# Patient Record
Sex: Female | Born: 1965 | Race: White | Hispanic: No | Marital: Married | Smoking: Never smoker
Health system: Southern US, Community
[De-identification: ages and names within clinical notes are randomized; demographics above are authoritative.]

## PROBLEM LIST (undated history)

## (undated) DIAGNOSIS — I10 Essential (primary) hypertension: Secondary | ICD-10-CM

## (undated) DIAGNOSIS — E119 Type 2 diabetes mellitus without complications: Secondary | ICD-10-CM

## (undated) DIAGNOSIS — E785 Hyperlipidemia, unspecified: Secondary | ICD-10-CM

## (undated) HISTORY — DX: Essential (primary) hypertension: I10

## (undated) HISTORY — PX: POLYPECTOMY: SHX149

## (undated) HISTORY — PX: SPINE SURGERY: SHX786

## (undated) HISTORY — PX: HERNIA REPAIR: SHX51

## (undated) HISTORY — DX: Hyperlipidemia, unspecified: E78.5

## (undated) HISTORY — DX: Type 2 diabetes mellitus without complications: E11.9

---

## 1997-12-03 ENCOUNTER — Other Ambulatory Visit: Admission: RE | Admit: 1997-12-03 | Discharge: 1997-12-03 | Payer: Self-pay | Admitting: Obstetrics & Gynecology

## 2001-04-01 ENCOUNTER — Other Ambulatory Visit: Admission: RE | Admit: 2001-04-01 | Discharge: 2001-04-01 | Payer: Self-pay | Admitting: Gynecology

## 2002-04-21 ENCOUNTER — Other Ambulatory Visit: Admission: RE | Admit: 2002-04-21 | Discharge: 2002-04-21 | Payer: Self-pay | Admitting: Gynecology

## 2003-05-18 ENCOUNTER — Other Ambulatory Visit: Admission: RE | Admit: 2003-05-18 | Discharge: 2003-05-18 | Payer: Self-pay | Admitting: Gynecology

## 2003-08-18 ENCOUNTER — Encounter: Admission: RE | Admit: 2003-08-18 | Discharge: 2003-08-18 | Payer: Self-pay | Admitting: Gynecology

## 2004-09-12 ENCOUNTER — Other Ambulatory Visit: Admission: RE | Admit: 2004-09-12 | Discharge: 2004-09-12 | Payer: Self-pay | Admitting: Gynecology

## 2004-09-29 ENCOUNTER — Ambulatory Visit (HOSPITAL_COMMUNITY): Admission: RE | Admit: 2004-09-29 | Discharge: 2004-09-29 | Payer: Self-pay | Admitting: Gynecology

## 2005-12-25 ENCOUNTER — Other Ambulatory Visit: Admission: RE | Admit: 2005-12-25 | Discharge: 2005-12-25 | Payer: Self-pay | Admitting: Gynecology

## 2006-05-23 ENCOUNTER — Ambulatory Visit: Payer: Self-pay | Admitting: General Practice

## 2007-01-22 ENCOUNTER — Other Ambulatory Visit: Admission: RE | Admit: 2007-01-22 | Discharge: 2007-01-22 | Payer: Self-pay | Admitting: Gynecology

## 2007-05-28 ENCOUNTER — Ambulatory Visit: Payer: Self-pay | Admitting: General Practice

## 2007-12-06 ENCOUNTER — Encounter: Admission: RE | Admit: 2007-12-06 | Discharge: 2007-12-06 | Payer: Self-pay | Admitting: Family Medicine

## 2008-05-06 ENCOUNTER — Ambulatory Visit (HOSPITAL_COMMUNITY): Admission: RE | Admit: 2008-05-06 | Discharge: 2008-05-06 | Payer: Self-pay | Admitting: Neurosurgery

## 2009-06-09 ENCOUNTER — Ambulatory Visit: Payer: Self-pay | Admitting: General Practice

## 2010-06-01 ENCOUNTER — Ambulatory Visit: Payer: Self-pay | Admitting: General Practice

## 2010-12-20 NOTE — Op Note (Signed)
[name], [name]                ACCOUNT NO.:  [id number]   MEDICAL RECORD NO.:  [id number]          PATIENT TYPE:  OIB   LOCATION:  3533                         FACILITY:  [facility]   PHYSICIAN:  Cristi Loron, M.D.DATE OF BIRTH:  1966/01/24   DATE OF PROCEDURE:  05/06/2008  DATE OF DISCHARGE:  05/06/2008                               OPERATIVE REPORT   BRIEF HISTORY:  The patient is a 45 year old white female who has  suffered from back and right leg pain consistent with a right S1  radiculopathy.  She failed medical management and was worked up with a  lumbar MRI which demonstrated that she had a herniated disk at L5-S1 on  the right.  I discussed various treatment options with the patient  including surgery.  She has weighed the risks, benefits, and  alternatives of the surgery and decided to proceed with a right L5-S1  diskectomy.   PREOPERATIVE DIAGNOSES:  Right L5-S1 herniated nucleus pulposus, spinal  stenosis, lumbar radiculopathy, lumbago.   POSTOPERATIVE DIAGNOSES:  Right L5-S1 herniated nucleus pulposus, spinal  stenosis, lumbar radiculopathy, and lumbago.   PROCEDURE:  Right L5-S1 diskectomy using microdissection.   SURGEON:  Cristi Loron, MD   ASSISTANT:  Hewitt Shorts, MD   ANESTHESIA:  General endotracheal.   ESTIMATED BLOOD LOSS:  25 mL.   SPECIMENS:  None.   DRAINS:  None.   COMPLICATIONS:  None.   DESCRIPTION OF PROCEDURE:  The patient was brought to the operating room  by the anesthesia team.  General endotracheal anesthesia was induced.  The patient was turned to the prone position on the Wilson frame.  Her  lumbosacral region was then prepared with Betadine scrub and Betadine  solution.  Sterile drapes were applied and injected area to be incised  with Marcaine with epinephrine solution.  I used a scalpel to make a  linear midline incision over the L5-S1 interspace.  I used  electrocautery to perform a right-sided subperiosteal  dissection  exposing the right spinous process lamina of L5 and upper sacrum.  We  obtained intraoperative radiograph to confirm our location and then  inserted the [facility] retractor for exposure.   We then brought the operative microscope into the field under its  magnification and illumination.  We completed the  microdissection/decompression.  I used a high-speed drill to perform a  right L5 laminotomy.  We widened the laminotomy with Kerrison punch and  removed the right L5-S1 ligament flavum.  We removed the cephalad aspect  of the S1 lamina, i.e., performed a foraminotomy about the right S1  nerve root.  We then used microdissection to free up the nerve root and  thecal sac from the epidural tissues.  Dr. Newell Coral then gently  retracted the neural structures medially with the tracheal retractor.  This exposed the free fragment of intervertebral disk herniation.  We  removed the multiple fragments using a pituitary forceps.  There was  some disk fragments in the axilla between the S1 nerve root and the  thecal sac.  We removed all the free fragments.  I then inspected  the  intervertebral disk.  There was a small hole in the annulus, but did not  appear to be any pending herniations.  We therefore did not enter into  the intervertebral disk space.  I then palpated along the ventral  surface of thecal sac along the exit route of the right S1 nerve root.  Neural structures were well decompressed.  We then obtained hemostasis  using bipolar electrocautery.  We irrigated the wound out with  bacitracin solution and removed the retractor and then reapproximated  the patient's thoracolumbar fascia with interrupted #1 Vicryl suture,  subcutaneous tissue with interrupted 2-0 Vicryl suture, and the skin  with Steri-Strips and benzoin.  The wound was then coated with  bacitracin ointment.  Sterile dressing was applied.  The drapes were  removed, and the patient was subsequently returned to  supine position  where she was extubated by the anesthesia team and transported to the  Postanesthesia Care Unit in stable condition.  All sponge, instrument,  and needle counts were correct at the end of this case.      Cristi Loron, M.D.  Electronically Signed     JDJ/MEDQ  D:  05/07/2008  T:  05/07/2008  Job:  [id number]

## 2011-05-08 LAB — CBC
HCT: 37.3
Hemoglobin: 12.4
MCHC: 33.4
MCV: 85.5
Platelets: 314
RBC: 4.36
RDW: 14
WBC: 11.3 — ABNORMAL HIGH

## 2011-05-08 LAB — COMPREHENSIVE METABOLIC PANEL
ALT: 17
AST: 15
Albumin: 3.6
Alkaline Phosphatase: 43
BUN: 4 — ABNORMAL LOW
CO2: 27
Calcium: 8.7
Chloride: 99
Creatinine, Ser: 0.47
GFR calc Af Amer: >60
GFR calc non Af Amer: >60
Glucose, Bld: 129 — ABNORMAL HIGH
Potassium: 3.5
Sodium: 134 — ABNORMAL LOW
Total Bilirubin: 1.1
Total Protein: 6.3

## 2011-05-08 LAB — HCG, SERUM, QUALITATIVE: Preg, Serum: NEGATIVE

## 2011-05-30 ENCOUNTER — Ambulatory Visit: Payer: Self-pay

## 2012-05-29 ENCOUNTER — Ambulatory Visit: Payer: Self-pay

## 2013-06-05 ENCOUNTER — Ambulatory Visit: Payer: Self-pay | Admitting: General Practice

## 2015-02-08 ENCOUNTER — Ambulatory Visit (INDEPENDENT_AMBULATORY_CARE_PROVIDER_SITE_OTHER): Payer: No Typology Code available for payment source | Admitting: Emergency Medicine

## 2015-02-08 VITALS — BP 138/72 | HR 77 | Temp 98.0°F | Resp 17 | Ht 64.25 in | Wt 160.0 lb

## 2015-02-08 DIAGNOSIS — Z0000 Encounter for general adult medical examination without abnormal findings: Secondary | ICD-10-CM

## 2015-02-08 DIAGNOSIS — E119 Type 2 diabetes mellitus without complications: Secondary | ICD-10-CM

## 2015-02-08 DIAGNOSIS — E785 Hyperlipidemia, unspecified: Secondary | ICD-10-CM | POA: Diagnosis not present

## 2015-02-08 LAB — POCT CBC
GRANULOCYTE PERCENT: 62.7 % (ref 37–80)
HCT, POC: 39.8 % (ref 37.7–47.9)
Hemoglobin: 12.6 g/dL (ref 12.2–16.2)
Lymph, poc: 1.5 (ref 0.6–3.4)
MCH, POC: 24.8 pg — AB (ref 27–31.2)
MCHC: 31.8 g/dL (ref 31.8–35.4)
MCV: 78 fL — AB (ref 80–97)
MID (cbc): 0.4 (ref 0–0.9)
MPV: 6.9 fL (ref 0–99.8)
POC Granulocyte: 3.2 (ref 2–6.9)
POC LYMPH PERCENT: 30 %L (ref 10–50)
POC MID %: 7.3 % (ref 0–12)
Platelet Count, POC: 315 10*3/uL (ref 142–424)
RBC: 5.1 M/uL (ref 4.04–5.48)
RDW, POC: 20.7 %
WBC: 5.1 10*3/uL (ref 4.6–10.2)

## 2015-02-08 LAB — POCT GLYCOSYLATED HEMOGLOBIN (HGB A1C): Hemoglobin A1C: 6

## 2015-02-08 LAB — LIPID PANEL
Cholesterol: 228 mg/dL — ABNORMAL HIGH (ref 0–200)
HDL: 52 mg/dL (ref >=46)
LDL Cholesterol: 135 mg/dL — ABNORMAL HIGH (ref 0–99)
Total CHOL/HDL Ratio: 4.4 Ratio
Triglycerides: 204 mg/dL — ABNORMAL HIGH (ref <150)
VLDL: 41 mg/dL — ABNORMAL HIGH (ref 0–40)

## 2015-02-08 LAB — COMPLETE METABOLIC PANEL WITH GFR
ALT: 11 U/L (ref 0–35)
AST: 14 U/L (ref 0–37)
Albumin: 4.4 g/dL (ref 3.5–5.2)
Alkaline Phosphatase: 42 U/L (ref 39–117)
BUN: 12 mg/dL (ref 6–23)
CHLORIDE: 103 meq/L (ref 96–112)
CO2: 25 mEq/L (ref 19–32)
Calcium: 9.3 mg/dL (ref 8.4–10.5)
Creat: 0.58 mg/dL (ref 0.50–1.10)
GFR, Est Non African American: >89 mL/min
Glucose, Bld: 108 mg/dL — ABNORMAL HIGH (ref 70–99)
Potassium: 4.8 mEq/L (ref 3.5–5.3)
Sodium: 140 mEq/L (ref 135–145)
Total Bilirubin: 0.6 mg/dL (ref 0.2–1.2)
Total Protein: 6.9 g/dL (ref 6.0–8.3)

## 2015-02-08 LAB — GLUCOSE, POCT (MANUAL RESULT ENTRY): POC Glucose: 104 mg/dl — AB (ref 70–99)

## 2015-02-08 MED ORDER — METFORMIN HCL 500 MG PO TABS
500.0000 mg | ORAL_TABLET | Freq: Two times a day (BID) | ORAL | Status: DC
Start: 2015-02-08 — End: 2016-01-08

## 2015-02-08 MED ORDER — SIMVASTATIN 40 MG PO TABS: 40.0000 mg | ORAL_TABLET | Freq: Every day | ORAL | Status: DC

## 2015-02-08 NOTE — Progress Notes (Addendum)
Subjective:  This chart was scribed for Arlyss Queen MD,  by Tamsen Roers, at [facility].  This patient was seen in room 2 and the patient's care was started at 10:56 AM.    Patient ID: [name], female    DOB: September 21, 1965, 49 y.o.   MRN: [id number] Chief Complaint  Patient presents with  . Medication Refill    Simvastatin and metformin    HPI  HPI Comments: [name] is a 49 y.o. female with a history of diabetes who presents to the [facility] for medication refill (Simvastatin and Metformin). Patient is complaint with her pap smears, mammograms and has been getting her eyes checked. She has no other complaints today.   Past check-ups: Per patient, her last diabetes check up (with a doctor) was 3 years ago and last cholesterol check (with a doctor) was 7 years ago. She has been having her sugar checked by her company every three months and has been receiving medication but was told that she needs to find a PCP.    There are no active problems to display for this patient.  Past Medical History  Diagnosis Date  . Diabetes mellitus without complication    Past Surgical History  Procedure Laterality Date  . Spine surgery  Disk surgery   No Known Allergies Prior to Admission medications   Medication Sig Start Date End Date Taking? Authorizing Provider  metFORMIN (GLUCOPHAGE) 500 MG tablet Take by mouth 2 (two) times daily with a meal.   Yes Historical Provider, MD  simvastatin (ZOCOR) 40 MG tablet Take 40 mg by mouth daily.   Yes Historical Provider, MD   History   Social History  . Marital Status: Married    Spouse Name: N/A  . Number of Children: N/A  . Years of Education: N/A   Occupational History  . Not on file.   Social History Main Topics  . Smoking status: Never Smoker   . Smokeless tobacco: Not on file  . Alcohol Use: No  . Drug Use: No  . Sexual Activity: Not on file   Other Topics Concern  . Not on  file   Social History Narrative  . No narrative on file      No current outpatient prescriptions on file prior to visit.   No current facility-administered medications on file prior to visit.    No Known Allergies     Review of Systems  Constitutional: Negative for fever and chills.  Eyes: Negative for pain, redness and itching.  Respiratory: Negative for cough, choking and shortness of breath.   Gastrointestinal: Negative for nausea, vomiting, diarrhea and constipation.  Musculoskeletal: Negative for neck pain and neck stiffness.       Objective:   Physical Exam   CONSTITUTIONAL: Well developed/well nourished HEAD: Normocephalic/atraumatic EYES: EOMI/PERRL ENMT: Mucous membranes moist NECK: supple no meningeal signs SPINE/BACK:entire spine nontender CV: S1/S2 noted, no murmurs/rubs/gallops noted LUNGS: Lungs are clear to auscultation bilaterally, no apparent distress ABDOMEN: soft, nontender, no rebound or guarding, bowel sounds noted throughout abdomen GU:no cva tenderness NEURO: Pt is awake/alert/appropriate, moves all extremitiesx4.  No facial droop.   EXTREMITIES: pulses normal/equal, full ROM SKIN: warm, color normal PSYCH: no abnormalities of mood noted, alert and oriented to situation  Filed Vitals:   02/08/15 0937  BP: 138/72  Pulse: 77  Temp: 98 F (36.7 C)  TempSrc: Oral  Resp: 17  Height: 5' 4.25" (1.632 m)  Weight: 160  lb (72.576 kg)  SpO2: 99%   Meds ordered this encounter  Medications  . DISCONTD: simvastatin (ZOCOR) 40 MG tablet    Sig: Take 40 mg by mouth daily.  Marland Kitchen DISCONTD: metFORMIN (GLUCOPHAGE) 500 MG tablet    Sig: Take by mouth 2 (two) times daily with a meal.  . simvastatin (ZOCOR) 40 MG tablet    Sig: Take 1 tablet (40 mg total) by mouth daily.    Dispense:  30 tablet    Refill:  11  . metFORMIN (GLUCOPHAGE) 500 MG tablet    Sig: Take 1 tablet (500 mg total) by mouth 2 (two) times daily with a meal.    Dispense:  60 tablet      Refill:  11   Results for orders placed or performed in visit on 02/08/15  POCT CBC  Result Value Ref Range   WBC 5.1 4.6 - 10.2 K/uL   Lymph, poc 1.5 0.6 - 3.4   POC LYMPH PERCENT 30.0 10 - 50 %L   MID (cbc) 0.4 0 - 0.9   POC MID % 7.3 0 - 12 %M   POC Granulocyte 3.2 2 - 6.9   Granulocyte percent 62.7 37 - 80 %G   RBC 5.10 4.04 - 5.48 M/uL   Hemoglobin 12.6 12.2 - 16.2 g/dL   HCT, POC 39.8 37.7 - 47.9 %   MCV 78.0 (A) 80 - 97 fL   MCH, POC 24.8 (A) 27 - 31.2 pg   MCHC 31.8 31.8 - 35.4 g/dL   RDW, POC 20.7 %   Platelet Count, POC 315 142 - 424 K/uL   MPV 6.9 0 - 99.8 fL  POCT glucose (manual entry)  Result Value Ref Range   POC Glucose 104 (A) 70 - 99 mg/dl  POCT glycosylated hemoglobin (Hb A1C)  Result Value Ref Range   Hemoglobin A1C 6.0       Assessment & Plan:  1. Type 2 diabetes mellitus without complication Hemoglobin Y1O is perfect. - POCT glucose (manual entry) - POCT glycosylated hemoglobin (Hb A1C) - Microalbumin, urine - COMPLETE METABOLIC PANEL WITH GFR  2. Annual physical exam Normal exam today she has regular GYN checkups as well as mammogram - POCT CBC  3. Hyperlipidemia Zocor was refilled today. - Lipid panel   I personally performed the services described in this documentation, which was scribed in my presence. The recorded information has been reviewed and is accurate.  Arlyss Queen, MD  [facility], [facility]  02/08/2015 11:40 AM

## 2015-02-09 LAB — MICROALBUMIN, URINE: Microalb, Ur: 1.2 mg/dL (ref <2.0)

## 2015-02-13 ENCOUNTER — Encounter: Payer: Self-pay | Admitting: Family Medicine

## 2015-05-19 ENCOUNTER — Other Ambulatory Visit: Payer: Self-pay | Admitting: Emergency Medicine

## 2015-05-19 DIAGNOSIS — Z1231 Encounter for screening mammogram for malignant neoplasm of breast: Secondary | ICD-10-CM

## 2015-05-25 ENCOUNTER — Ambulatory Visit
Admission: RE | Admit: 2015-05-25 | Discharge: 2015-05-25 | Disposition: A | Discharge status: Home / Self Care | Payer: No Typology Code available for payment source | Source: Ambulatory Visit | Attending: Emergency Medicine | Admitting: Emergency Medicine

## 2015-05-25 DIAGNOSIS — Z1231 Encounter for screening mammogram for malignant neoplasm of breast: Secondary | ICD-10-CM | POA: Insufficient documentation

## 2015-10-05 ENCOUNTER — Telehealth: Payer: Self-pay

## 2015-10-05 NOTE — Telephone Encounter (Signed)
Patient is calling to request refills for metformin and simvastatin sent to his job in his attention. Fax: [phone]

## 2015-10-06 NOTE — Telephone Encounter (Signed)
He needs an office visit. Called phone, out of service.

## 2016-01-08 ENCOUNTER — Ambulatory Visit (INDEPENDENT_AMBULATORY_CARE_PROVIDER_SITE_OTHER): Payer: No Typology Code available for payment source | Admitting: Family Medicine

## 2016-01-08 VITALS — BP 164/68 | HR 79 | Temp 97.6°F | Resp 18 | Ht 64.0 in | Wt 157.2 lb

## 2016-01-08 DIAGNOSIS — Z130 Encounter for screening for diseases of the blood and blood-forming organs and certain disorders involving the immune mechanism: Secondary | ICD-10-CM

## 2016-01-08 DIAGNOSIS — Z1389 Encounter for screening for other disorder: Secondary | ICD-10-CM | POA: Diagnosis not present

## 2016-01-08 DIAGNOSIS — Z1211 Encounter for screening for malignant neoplasm of colon: Secondary | ICD-10-CM

## 2016-01-08 DIAGNOSIS — Z1212 Encounter for screening for malignant neoplasm of rectum: Secondary | ICD-10-CM | POA: Diagnosis not present

## 2016-01-08 DIAGNOSIS — Z23 Encounter for immunization: Secondary | ICD-10-CM

## 2016-01-08 DIAGNOSIS — E1165 Type 2 diabetes mellitus with hyperglycemia: Secondary | ICD-10-CM | POA: Insufficient documentation

## 2016-01-08 DIAGNOSIS — Z136 Encounter for screening for cardiovascular disorders: Secondary | ICD-10-CM | POA: Diagnosis not present

## 2016-01-08 DIAGNOSIS — Z1329 Encounter for screening for other suspected endocrine disorder: Secondary | ICD-10-CM | POA: Diagnosis not present

## 2016-01-08 DIAGNOSIS — E119 Type 2 diabetes mellitus without complications: Secondary | ICD-10-CM | POA: Diagnosis not present

## 2016-01-08 DIAGNOSIS — Z113 Encounter for screening for infections with a predominantly sexual mode of transmission: Secondary | ICD-10-CM | POA: Diagnosis not present

## 2016-01-08 DIAGNOSIS — E785 Hyperlipidemia, unspecified: Secondary | ICD-10-CM | POA: Diagnosis not present

## 2016-01-08 DIAGNOSIS — Z0000 Encounter for general adult medical examination without abnormal findings: Secondary | ICD-10-CM | POA: Diagnosis not present

## 2016-01-08 DIAGNOSIS — R030 Elevated blood-pressure reading, without diagnosis of hypertension: Secondary | ICD-10-CM | POA: Diagnosis not present

## 2016-01-08 DIAGNOSIS — Z1383 Encounter for screening for respiratory disorder NEC: Secondary | ICD-10-CM | POA: Diagnosis not present

## 2016-01-08 DIAGNOSIS — IMO0001 Reserved for inherently not codable concepts without codable children: Secondary | ICD-10-CM

## 2016-01-08 LAB — LIPID PANEL
Cholesterol: 206 mg/dL — ABNORMAL HIGH (ref 125–200)
HDL: 63 mg/dL (ref >=46)
LDL CALC: 112 mg/dL (ref <130)
Total CHOL/HDL Ratio: 3.3 Ratio (ref <=5.0)
Triglycerides: 155 mg/dL — ABNORMAL HIGH (ref <150)
VLDL: 31 mg/dL — ABNORMAL HIGH (ref <30)

## 2016-01-08 LAB — POCT URINALYSIS DIP (MANUAL ENTRY)
BILIRUBIN UA: NEGATIVE
Bilirubin, UA: NEGATIVE
Blood, UA: NEGATIVE
Glucose, UA: NEGATIVE
NITRITE UA: NEGATIVE
Protein Ur, POC: NEGATIVE
Spec Grav, UA: 1.015
Urobilinogen, UA: 0.2
pH, UA: 7

## 2016-01-08 LAB — COMPREHENSIVE METABOLIC PANEL
ALT: 14 U/L (ref 6–29)
AST: 18 U/L (ref 10–35)
Albumin: 4.6 g/dL (ref 3.6–5.1)
Alkaline Phosphatase: 43 U/L (ref 33–130)
BUN: 11 mg/dL (ref 7–25)
CO2: 29 mmol/L (ref 20–31)
CREATININE: 0.68 mg/dL (ref 0.50–1.05)
Calcium: 9.5 mg/dL (ref 8.6–10.4)
Chloride: 102 mmol/L (ref 98–110)
Glucose, Bld: 144 mg/dL — ABNORMAL HIGH (ref 65–99)
Potassium: 4.7 mmol/L (ref 3.5–5.3)
Sodium: 138 mmol/L (ref 135–146)
Total Bilirubin: 0.5 mg/dL (ref 0.2–1.2)
Total Protein: 7.3 g/dL (ref 6.1–8.1)

## 2016-01-08 LAB — CBC
HEMATOCRIT: 40.2 % (ref 35.0–45.0)
Hemoglobin: 12.7 g/dL (ref 11.7–15.5)
MCH: 25.6 pg — ABNORMAL LOW (ref 27.0–33.0)
MCHC: 31.6 g/dL — ABNORMAL LOW (ref 32.0–36.0)
MCV: 81 fL (ref 80.0–100.0)
MPV: 9.6 fL (ref 7.5–12.5)
Platelets: 330 10*3/uL (ref 140–400)
RBC: 4.96 MIL/uL (ref 3.80–5.10)
RDW: 15.9 % — ABNORMAL HIGH (ref 11.0–15.0)
WBC: 5.4 10*3/uL (ref 3.8–10.8)

## 2016-01-08 LAB — MICROALBUMIN / CREATININE URINE RATIO
Creatinine, Urine: 86 mg/dL (ref 20–320)
MICROALB UR: 0.4 mg/dL
Microalb Creat Ratio: 5 mcg/mg creat (ref <30)

## 2016-01-08 LAB — TSH: TSH: 1.01 mIU/L

## 2016-01-08 LAB — POCT GLYCOSYLATED HEMOGLOBIN (HGB A1C): Hemoglobin A1C: 6.5

## 2016-01-08 MED ORDER — METFORMIN HCL 500 MG PO TABS: 500.0000 mg | ORAL_TABLET | Freq: Two times a day (BID) | ORAL | Status: DC
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
## 2016-01-08 NOTE — Patient Instructions (Signed)
Managing Your High Blood Pressure Blood pressure is a measurement of how forceful your blood is pressing against the walls of the arteries. Arteries are muscular tubes within the circulatory system. Blood pressure does not stay the same. Blood pressure rises when you are active, excited, or nervous; and it lowers during sleep and relaxation. If the numbers measuring your blood pressure stay above normal most of the time, you are at risk for health problems. High blood pressure (hypertension) is a long-term (chronic) condition in which blood pressure is elevated. A blood pressure reading is recorded as two numbers, such as 120 over 80 (or 120/80). The first, higher number is called the systolic pressure. It is a measure of the pressure in your arteries as the heart beats. The second, lower number is called the diastolic pressure. It is a measure of the pressure in your arteries as the heart relaxes between beats.  Keeping your blood pressure in a normal range is important to your overall health and prevention of health problems, such as heart disease and stroke. When your blood pressure is uncontrolled, your heart has to work harder than normal. High blood pressure is a very common condition in adults because blood pressure tends to rise with age. Men and women are equally likely to have hypertension but at different times in life. Before age 45, men are more likely to have hypertension. After 52 years of age, women are more likely to have it. Hypertension is especially common in African Americans. This condition often has no signs or symptoms. The cause of the condition is usually not known. Your caregiver can help you come up with a plan to keep your blood pressure in a normal, healthy range. BLOOD PRESSURE STAGES Blood pressure is classified into four stages: normal, prehypertension, stage 1, and stage 2. Your blood pressure reading will be used to determine what type of treatment, if any, is necessary.  Appropriate treatment options are tied to these four stages:  Normal  Systolic pressure (mm Hg): below 120.  Diastolic pressure (mm Hg): below 80. Prehypertension  Systolic pressure (mm Hg): 120 to 139.  Diastolic pressure (mm Hg): 80 to 89. Stage1  Systolic pressure (mm Hg): 140 to 159.  Diastolic pressure (mm Hg): 90 to 99. Stage2  Systolic pressure (mm Hg): 160 or above.  Diastolic pressure (mm Hg): 100 or above. RISKS RELATED TO HIGH BLOOD PRESSURE Managing your blood pressure is an important responsibility. Uncontrolled high blood pressure can lead to:  A heart attack.  A stroke.  A weakened blood vessel (aneurysm).  Heart failure.  Kidney damage.  Eye damage.  Metabolic syndrome.  Memory and concentration problems. HOW TO MANAGE YOUR BLOOD PRESSURE Blood pressure can be managed effectively with lifestyle changes and medicines (if needed). Your caregiver will help you come up with a plan to bring your blood pressure within a normal range. Your plan should include the following: Education  Read all information provided by your caregivers about how to control blood pressure.  Educate yourself on the latest guidelines and treatment recommendations. New research is always being done to further define the risks and treatments for high blood pressure. Lifestylechanges  Control your weight.  Avoid smoking.  Stay physically active.  Reduce the amount of salt in your diet.  Reduce stress.  Control any chronic conditions, such as high cholesterol or diabetes.  Reduce your alcohol intake. Medicines  Several medicines (antihypertensive medicines) are available, if needed, to bring blood pressure within a normal range.  Communication  Review all the medicines you take with your caregiver because there may be side effects or interactions.  Talk with your caregiver about your diet, exercise habits, and other lifestyle factors that may be contributing to  high blood pressure.  See your caregiver regularly. Your caregiver can help you create and adjust your plan for managing high blood pressure. RECOMMENDATIONS FOR TREATMENT AND FOLLOW-UP  The following recommendations are based on current guidelines for managing high blood pressure in nonpregnant adults. Use these recommendations to identify the proper follow-up period or treatment option based on your blood pressure reading. You can discuss these options with your caregiver.  Systolic pressure of 123456 to [id number] or diastolic pressure of 80 to 89: Follow up with your caregiver as directed.  Systolic pressure of [id number] to 0000000 or diastolic pressure of 90 to 100: Follow up with your caregiver within 2 months.  Systolic pressure above 0000000 or diastolic pressure above [id number]: Follow up with your caregiver within 1 month.  Systolic pressure above [id number] or diastolic pressure above [id number]: Consider antihypertensive therapy; follow up with your caregiver within 1 week.  Systolic pressure above [id number] or diastolic pressure above 123456: Begin antihypertensive therapy; follow up with your caregiver within 1 week.   This information is not intended to replace advice given to you by your health care provider. Make sure you discuss any questions you have with your health care provider.   Document Released: 04/17/2012 Document Reviewed: 04/17/2012 Elsevier Interactive Patient Education 2016 [employer]  Get These Tests  Blood Pressure- Have your blood pressure checked by your healthcare provider at least once a year.  Normal blood pressure is 120/80.  Weight- Have your body mass index (BMI) calculated to screen for obesity.  BMI is a measure of body fat based on height and weight.  You can calculate your own BMI at [employer]  Cholesterol- Have your cholesterol checked every year.  Diabetes- Have your blood sugar checked every year if you have high blood pressure, high cholesterol, a  family history of diabetes or if you are overweight.  Pap Test - Have a pap test every 1 to 5 years if you have been sexually active.  If you are older than 65 and recent pap tests have been normal you may not need additional pap tests.  In addition, if you have had a hysterectomy  for benign disease additional pap tests are not necessary.  Mammogram-Yearly mammograms are essential for early detection of breast cancer  Screening for Colon Cancer- Colonoscopy starting at age 76. Screening may begin sooner depending on your family history and other health conditions.  Follow up colonoscopy as directed by your Gastroenterologist.  Screening for Osteoporosis- Screening begins at age 88 with bone density scanning, sooner if you are at higher risk for developing Osteoporosis.  Get these medicines  Calcium with Vitamin D- Your body requires 1200-1500 mg of Calcium a day and [phone] IU of Vitamin D a day.  You can only absorb 500 mg of Calcium at a time therefore Calcium must be taken in 2 or 3 separate doses throughout the day.  Hormones- Hormone therapy has been associated with increased risk for certain cancers and heart disease.  Talk to your healthcare provider about if you need relief from menopausal symptoms.  Aspirin- Ask your healthcare provider about taking Aspirin to prevent Heart Disease and Stroke.  Get these Immuniztions  Flu shot- Every fall  Pneumonia shot- Once after the age of  33; if you are younger ask your healthcare provider if you need a pneumonia shot.  Tetanus- Every ten years.  Zostavax- Once after the age of 84 to prevent shingles.  Take these steps  Don't smoke- Your healthcare provider can help you quit. For tips on how to quit, ask your healthcare provider or go to www.smokefree.gov or call 1-800 QUIT-NOW.  Be physically active- Exercise 5 days a week for a minimum of 30 minutes.  If you are not already physically active, start slow and gradually work up to 30  minutes of moderate physical activity.  Try walking, dancing, bike riding, swimming, etc.  Eat a healthy diet- Eat a variety of healthy foods such as fruits, vegetables, whole grains, low fat milk, low fat cheeses, yogurt, lean meats, chicken, fish, eggs, dried beans, tofu, etc.  For more information go to www.thenutritionsource.org  Dental visit- Brush and floss teeth twice daily; visit your dentist twice a year.  Eye exam- Visit your Optometrist or Ophthalmologist yearly.  Drink alcohol in moderation- Limit alcohol intake to one drink or less a day.  Never drink and drive.  Depression- Your emotional health is as important as your physical health.  If you're feeling down or losing interest in things you normally enjoy, please talk to your healthcare provider.  Seat Belts- can save your life; always wear one  Smoke/Carbon Monoxide detectors- These detectors need to be installed on the appropriate level of your home.  Replace batteries at least once a year.  Violence- If anyone is threatening or hurting you, please tell your healthcare provider.  Living Will/ Health care power of attorney- Discuss with your healthcare provider and family.

## 2016-01-11 MED ORDER — ATORVASTATIN CALCIUM 20 MG PO TABS: 20.0000 mg | ORAL_TABLET | Freq: Every day | ORAL | Status: DC

## 2016-01-11 MED ORDER — PRAVASTATIN SODIUM 20 MG PO TABS: 20.0000 mg | ORAL_TABLET | Freq: Every day | ORAL | Status: DC

## 2016-01-12 ENCOUNTER — Encounter: Payer: Self-pay | Admitting: Family Medicine

## 2016-01-26 ENCOUNTER — Encounter: Payer: Self-pay | Admitting: Family Medicine

## 2016-02-22 ENCOUNTER — Other Ambulatory Visit: Payer: Self-pay | Admitting: Family Medicine

## 2016-02-23 LAB — CMP12+LP+TP+TSH+6AC+CBC/D/PLT
ALT: 16 IU/L (ref 0–32)
AST: 22 IU/L (ref 0–40)
Albumin/Globulin Ratio: 1.8 (ref 1.2–2.2)
Albumin: 4.3 g/dL (ref 3.5–5.5)
Alkaline Phosphatase: 47 IU/L (ref 39–117)
BASOS ABS: 0 10*3/uL (ref 0.0–0.2)
BUN/Creatinine Ratio: 23 (ref 9–23)
BUN: 13 mg/dL (ref 6–24)
Basos: 1 %
Bilirubin Total: 0.5 mg/dL (ref 0.0–1.2)
CHLORIDE: 97 mmol/L (ref 96–106)
Calcium: 8.9 mg/dL (ref 8.7–10.2)
Chol/HDL Ratio: 3 ratio units (ref 0.0–4.4)
Cholesterol, Total: 149 mg/dL (ref 100–199)
Creatinine, Ser: 0.57 mg/dL (ref 0.57–1.00)
EOS (ABSOLUTE): 0.1 10*3/uL (ref 0.0–0.4)
Eos: 2 %
Estimated CHD Risk: <0.5 times avg. (ref 0.0–1.0)
Free Thyroxine Index: 1.6 (ref 1.2–4.9)
GFR calc Af Amer: 123 mL/min/{1.73_m2} (ref >59)
GFR calc non Af Amer: 107 mL/min/{1.73_m2} (ref >59)
GGT: 18 IU/L (ref 0–60)
Globulin, Total: 2.4 g/dL (ref 1.5–4.5)
Glucose: 101 mg/dL — ABNORMAL HIGH (ref 65–99)
HDL: 49 mg/dL (ref >39)
HEMOGLOBIN: 11.7 g/dL (ref 11.1–15.9)
Hematocrit: 37.6 % (ref 34.0–46.6)
IMMATURE GRANULOCYTES: 0 %
Immature Grans (Abs): 0 10*3/uL (ref 0.0–0.1)
Iron: 86 ug/dL (ref 27–159)
LDH: 157 IU/L (ref 119–226)
LDL Calculated: 70 mg/dL (ref 0–99)
Lymphocytes Absolute: 1.7 10*3/uL (ref 0.7–3.1)
Lymphs: 31 %
MCH: 25.5 pg — ABNORMAL LOW (ref 26.6–33.0)
MCHC: 31.1 g/dL — ABNORMAL LOW (ref 31.5–35.7)
MCV: 82 fL (ref 79–97)
MONOS ABS: 0.3 10*3/uL (ref 0.1–0.9)
Monocytes: 6 %
Neutrophils Absolute: 3.4 10*3/uL (ref 1.4–7.0)
Neutrophils: 60 %
Phosphorus: 3.7 mg/dL (ref 2.5–4.5)
Platelets: 318 10*3/uL (ref 150–379)
Potassium: 4.2 mmol/L (ref 3.5–5.2)
RBC: 4.59 x10E6/uL (ref 3.77–5.28)
RDW: 17.5 % — AB (ref 12.3–15.4)
Sodium: 138 mmol/L (ref 134–144)
T3 Uptake Ratio: 24 % (ref 24–39)
T4, Total: 6.6 ug/dL (ref 4.5–12.0)
TSH: 1.51 u[IU]/mL (ref 0.450–4.500)
Total Protein: 6.7 g/dL (ref 6.0–8.5)
Triglycerides: 148 mg/dL (ref 0–149)
URIC ACID: 5.4 mg/dL (ref 2.5–7.1)
VLDL Cholesterol Cal: 30 mg/dL (ref 5–40)
WBC: 5.6 10*3/uL (ref 3.4–10.8)

## 2016-02-23 LAB — HGB A1C W/O EAG: Hgb A1c MFr Bld: 6.2 % — ABNORMAL HIGH (ref 4.8–5.6)

## 2016-05-24 ENCOUNTER — Other Ambulatory Visit: Payer: Self-pay | Admitting: Family Medicine

## 2016-05-24 DIAGNOSIS — Z1231 Encounter for screening mammogram for malignant neoplasm of breast: Secondary | ICD-10-CM

## 2016-05-29 ENCOUNTER — Ambulatory Visit
Admission: RE | Admit: 2016-05-29 | Discharge: 2016-05-29 | Disposition: A | Discharge status: Home / Self Care | Payer: No Typology Code available for payment source | Source: Ambulatory Visit | Attending: Family Medicine | Admitting: Family Medicine

## 2016-05-29 DIAGNOSIS — Z1231 Encounter for screening mammogram for malignant neoplasm of breast: Secondary | ICD-10-CM | POA: Diagnosis not present

## 2016-08-07 HISTORY — PX: COLONOSCOPY: SHX174

## 2016-10-10 ENCOUNTER — Ambulatory Visit: Payer: Self-pay | Admitting: Registered Nurse

## 2016-10-10 VITALS — BP 130/78 | HR 82 | Temp 98.2°F

## 2016-10-10 DIAGNOSIS — J0101 Acute recurrent maxillary sinusitis: Secondary | ICD-10-CM

## 2016-10-10 DIAGNOSIS — J0111 Acute recurrent frontal sinusitis: Secondary | ICD-10-CM

## 2016-10-10 DIAGNOSIS — H6593 Unspecified nonsuppurative otitis media, bilateral: Secondary | ICD-10-CM

## 2016-10-10 NOTE — Progress Notes (Signed)
Subjective:    Patient ID: [name], female    DOB: 02/03/1966, 51 y.o.   MRN: [id number]  Married 51y/o caucasian female here for  "scratchy" throat, non-productive cough, rhinorrhea, and watery eyes x2 days, Denies eye drainage, red or itchy. No OTCs. + teeth pain, headache, ear pain.  Denied ear discharge/fever/nausea/vomiting/diarrhea/nosebleeds.      Review of Systems  Constitutional: Positive for fatigue. Negative for activity change, appetite change, chills, diaphoresis, fever and unexpected weight change.  HENT: Positive for congestion, ear pain, postnasal drip, rhinorrhea, sinus pain, sinus pressure, sore throat and voice change. Negative for dental problem, drooling, ear discharge, facial swelling, hearing loss, mouth sores, nosebleeds, sneezing, tinnitus and trouble swallowing.   Eyes: Negative for photophobia, pain, discharge, redness, itching and visual disturbance.  Respiratory: Positive for cough. Negative for choking, chest tightness, shortness of breath, wheezing and stridor.   Cardiovascular: Negative for chest pain, palpitations and leg swelling.  Gastrointestinal: Negative for abdominal distention, abdominal pain, blood in stool, constipation, diarrhea, nausea and vomiting.  Endocrine: Negative for cold intolerance and heat intolerance.  Genitourinary: Negative for difficulty urinating, dysuria and hematuria.  Musculoskeletal: Negative for arthralgias, back pain, gait problem, joint swelling, myalgias, neck pain and neck stiffness.  Skin: Negative for color change, pallor, rash and wound.  Allergic/Immunologic: Positive for environmental allergies. Negative for food allergies.  Neurological: Positive for headaches. Negative for dizziness, tremors, seizures, syncope, facial asymmetry, speech difficulty, weakness, light-headedness and numbness.  Hematological: Negative for adenopathy. Does not bruise/bleed easily.  Psychiatric/Behavioral: Negative for agitation,  behavioral problems, confusion and sleep disturbance.       Objective:   Physical Exam  Constitutional: She is oriented to person, place, and time. She appears well-developed and well-nourished. She is active and cooperative.  Non-toxic appearance. She does not have a sickly appearance. She appears ill. No distress.  HENT:  Head: Normocephalic and atraumatic.  Right Ear: Hearing, external ear and ear canal normal. Tympanic membrane is erythematous. A middle ear effusion is present.  Left Ear: Hearing, external ear and ear canal normal. Tympanic membrane is erythematous. A middle ear effusion is present.  Nose: Mucosal edema and rhinorrhea present. No nose lacerations, sinus tenderness, nasal deformity, septal deviation or nasal septal hematoma. No epistaxis.  No foreign bodies. Right sinus exhibits maxillary sinus tenderness and frontal sinus tenderness. Left sinus exhibits maxillary sinus tenderness and frontal sinus tenderness.  Mouth/Throat: Uvula is midline and mucous membranes are normal. Mucous membranes are not pale, not dry and not cyanotic. She does not have dentures. No oral lesions. No trismus in the jaw. Normal dentition. No dental abscesses, uvula swelling, lacerations or dental caries. Posterior oropharyngeal edema and posterior oropharyngeal erythema present. No oropharyngeal exudate or tonsillar abscesses.  Cobblestoning posterior pharynx; bilateral nasal turbinates edema/erythema clear discharge; bilateral allergic shiners; bilateral TMs air fluid level slight erythema 7 oclock; hoarse voice  Eyes: Conjunctivae, EOM and lids are normal. Pupils are equal, round, and reactive to light. Right eye exhibits no chemosis, no discharge, no exudate and no hordeolum. No foreign body present in the right eye. Left eye exhibits no chemosis, no discharge, no exudate and no hordeolum. No foreign body present in the left eye. Right conjunctiva is not injected. Right conjunctiva has no hemorrhage.  Left conjunctiva is not injected. Left conjunctiva has no hemorrhage. No scleral icterus. Right eye exhibits normal extraocular motion and no nystagmus. Left eye exhibits normal extraocular motion and no nystagmus. Right pupil is round and reactive. Left pupil is  round and reactive. Pupils are equal.  Neck: Trachea normal and normal range of motion. Neck supple. No tracheal tenderness, no spinous process tenderness and no muscular tenderness present. No neck rigidity. No tracheal deviation, no edema, no erythema and normal range of motion present. No thyroid mass and no thyromegaly present.  Cardiovascular: Normal rate, regular rhythm, S1 normal, S2 normal, normal heart sounds and intact distal pulses.  PMI is not displaced.  Exam reveals no gallop and no friction rub.   No murmur heard. Pulmonary/Chest: Effort normal and breath sounds normal. No accessory muscle usage or stridor. No respiratory distress. She has no decreased breath sounds. She has no wheezes. She has no rhonchi. She has no rales. She exhibits no tenderness.  Speaks full sentences without difficulty  Abdominal: Soft. She exhibits no distension.  Musculoskeletal: Normal range of motion. She exhibits no edema or tenderness.       Right shoulder: Normal.       Left shoulder: Normal.       Right hip: Normal.       Left hip: Normal.       Right knee: Normal.       Left knee: Normal.       Cervical back: Normal.       Right hand: Normal.       Left hand: Normal.  Lymphadenopathy:       Head (right side): No submental, no submandibular, no tonsillar, no preauricular, no posterior auricular and no occipital adenopathy present.       Head (left side): No submental, no submandibular, no tonsillar, no preauricular, no posterior auricular and no occipital adenopathy present.    She has no cervical adenopathy.       Right cervical: No superficial cervical, no deep cervical and no posterior cervical adenopathy present.      Left cervical: No  superficial cervical, no deep cervical and no posterior cervical adenopathy present.  Neurological: She is alert and oriented to person, place, and time. She has normal strength. She is not disoriented. She displays no atrophy and no tremor. No cranial nerve deficit or sensory deficit. She exhibits normal muscle tone. She displays no seizure activity. Coordination and gait normal. GCS eye subscore is 4. GCS verbal subscore is 5. GCS motor subscore is 6.  Skin: Skin is warm, dry and intact. No abrasion, no bruising, no burn, no ecchymosis, no laceration, no lesion, no petechiae and no rash noted. She is not diaphoretic. No cyanosis or erythema. No pallor. Nails show no clubbing.  Psychiatric: She has a normal mood and affect. Her speech is normal and behavior is normal. Judgment and thought content normal. Cognition and memory are normal.  Nursing note and vitals reviewed.         Assessment & Plan:  A-acute recurrent maxillary and frontal sinusitis and bilateral otitis media effusion  P-Last augmentin Jun 2017 for sinusitis tolerated well See PDRX dispensed augmentin 875mg  po BID x 10 days #20 RF0.  Start nasal saline 2 sprays each nostril q2h wa. Given 1 UD from clinic stock. Shower BID.  Tylenol 1000mg  po QID prn pain. Given 4 UD from clinic [place].  Flonase 56mcg 1 spray each nostril BID #1 RF0  No evidence of systemic bacterial infection, non toxic and well hydrated.  I do not see where any further testing or imaging is necessary at this time.   I will suggest supportive care, rest, good hygiene and encourage the patient to take adequate  fluids.  The patient is to return to clinic or EMERGENCY ROOM if symptoms worsen or change significantly.   Patient verbalized agreement and understanding of treatment plan and had no further questions at this time.   P2:  Hand washing and cover cough  Supportive treatment.   No evidence of invasive bacterial infection, non toxic and well hydrated.  This  is most likely self limiting viral infection.  I do not see where any further testing or imaging is necessary at this time.   I will suggest supportive care, rest, good hygiene and encourage the patient to take adequate fluids.  The patient is to return to clinic or EMERGENCY ROOM if symptoms worsen or change significantly e.g. ear pain, fever, purulent discharge from ears or bleeding.   Patient verbalized agreement and understanding of treatment plan.

## 2016-12-11 ENCOUNTER — Telehealth: Payer: Self-pay | Admitting: *Deleted

## 2016-12-11 NOTE — Telephone Encounter (Signed)
Pt requests refills of Contour glucometer test strips and lancets. [facility] on [address].

## 2016-12-12 ENCOUNTER — Encounter: Payer: Self-pay | Admitting: *Deleted

## 2016-12-12 MED ORDER — GLUCOSE BLOOD VI STRP: ORAL_STRIP | 12 refills | Status: DC

## 2016-12-12 MED ORDER — LANCETS 28G MISC: 1.0000 | Freq: Four times a day (QID) | 3 refills | Status: DC | PRN

## 2016-12-18 ENCOUNTER — Telehealth: Payer: Self-pay | Admitting: *Deleted

## 2016-12-18 NOTE — Telephone Encounter (Signed)
Pt to RN office. Reports blood glucose test strips that were ordered were denied coverage by insurance company when she attempted to pick them up from pharmacy. RN called [employer].They report only the "One Touch" test strips are covered under the employee's Rx plan. Pt requests new Rx for covered strips be sent to [facility]. Lancets were covered.

## 2016-12-19 ENCOUNTER — Encounter: Payer: Self-pay | Admitting: *Deleted

## 2016-12-19 MED ORDER — GLUCOSE BLOOD VI STRP: ORAL_STRIP | 12 refills | Status: DC

## 2016-12-19 NOTE — Telephone Encounter (Signed)
Rx entered for patient to pick up.  Please notify patient.

## 2017-02-15 ENCOUNTER — Ambulatory Visit: Payer: Self-pay | Admitting: Registered Nurse

## 2017-02-15 VITALS — BP 137/81 | HR 74 | Temp 98.9°F

## 2017-02-15 DIAGNOSIS — S46912A Strain of unspecified muscle, fascia and tendon at shoulder and upper arm level, left arm, initial encounter: Secondary | ICD-10-CM

## 2017-02-15 MED ORDER — MENTHOL (TOPICAL ANALGESIC) 4 % EX GEL: 1.0000 | Freq: Four times a day (QID) | CUTANEOUS | 0 refills | Status: DC | PRN

## 2017-02-15 MED ORDER — IBUPROFEN 800 MG PO TABS: 800.0000 mg | ORAL_TABLET | Freq: Three times a day (TID) | ORAL | 0 refills | Status: AC | PRN

## 2017-02-15 NOTE — Progress Notes (Signed)
Subjective:    Patient ID: [name], female    DOB: [birth year], 51 y.o.   MRN: [id number]  51y/o caucasian married established patient  reports to L shoulder into L neck x6 days. Began using 800mg  Ibuprofen po BID and Biofreeze gel topical area bedtime 2 days ago after nurse visit and reports pain is much improved. Was previously not able to move neck without much pain in the morning but now she can have AROM without pain.  Tried thermacare neck patch and helped also.  Here for re-evaluation.  Denied weakness, paresthesias, rash, swelling, bruising, injury.  "Maybe I slept on it wrong".  Pain typically worst upon awakening when lifting left arm feels pain outside and upper back when moves above shoulder height.  Some pain under armpit on chest also with movement of left arm      Review of Systems  Constitutional: Negative for activity change, appetite change, chills, diaphoresis, fatigue and fever.  HENT: Negative for congestion and ear pain.   Eyes: Negative for pain and discharge.  Respiratory: Negative for cough and wheezing.   Cardiovascular: Negative for chest pain and leg swelling.  Gastrointestinal: Negative for diarrhea and vomiting.  Endocrine: Negative for cold intolerance and heat intolerance.  Genitourinary: Negative for decreased urine volume and difficulty urinating.  Musculoskeletal: Positive for myalgias and neck pain. Negative for arthralgias, back pain and neck stiffness.  Skin: Negative for rash.  Allergic/Immunologic: Negative for environmental allergies and food allergies.  Neurological: Negative for dizziness, tremors, seizures, syncope, facial asymmetry, speech difficulty, weakness, light-headedness, numbness and headaches.  Hematological: Negative for adenopathy. Does not bruise/bleed easily.  Psychiatric/Behavioral: Negative for agitation, confusion and sleep disturbance. The patient is not nervous/anxious.        Objective:   Physical Exam   Constitutional: She is oriented to person, place, and time. Vital signs are normal. She appears well-developed and well-nourished. She is active and cooperative.  Non-toxic appearance. She does not have a sickly appearance. She does not appear ill. No distress.  HENT:  Head: Normocephalic and atraumatic.  Right Ear: Hearing and external ear normal.  Left Ear: Hearing and external ear normal.  Nose: Nose normal.  Mouth/Throat: Uvula is midline, oropharynx is clear and moist and mucous membranes are normal. No oropharyngeal exudate.  Eyes: Pupils are equal, round, and reactive to light. Conjunctivae, EOM and lids are normal. Right eye exhibits no discharge. Left eye exhibits no discharge. No scleral icterus.  Neck: Trachea normal, normal range of motion and phonation normal. Neck supple. Muscular tenderness present. No tracheal tenderness and no spinous process tenderness present. No neck rigidity. No tracheal deviation, no edema, no erythema and normal range of motion present.    Trapezius tight slightly TTP left  Cardiovascular: Normal rate, regular rhythm, normal heart sounds and intact distal pulses.  Exam reveals no gallop and no friction rub.   No murmur heard. Pulses:      Radial pulses are 2+ on the right side, and 2+ on the left side.  Pulmonary/Chest: Effort normal and breath sounds normal. No accessory muscle usage or stridor. No respiratory distress. She has no decreased breath sounds. She has no wheezes. She has no rhonchi. She has no rales. She exhibits no tenderness.  Abdominal: Soft. She exhibits no distension. There is no guarding.  Musculoskeletal: She exhibits tenderness. She exhibits no edema or deformity.       Right shoulder: Normal.       Left shoulder: She exhibits decreased range of  motion, tenderness, pain and spasm. She exhibits no bony tenderness, no swelling, no effusion, no crepitus, no deformity, no laceration, normal pulse and normal strength.       Right elbow:  Normal.      Left elbow: Normal.       Right wrist: Normal.       Left wrist: Normal.       Right hip: Normal.       Left hip: Normal.       Right knee: Normal.       Left knee: Normal.       Cervical back: She exhibits tenderness and spasm. She exhibits normal range of motion, no bony tenderness, no swelling, no edema, no deformity, no laceration, no pain and normal pulse.       Thoracic back: Normal.       Lumbar back: Normal.       Right upper arm: Normal.       Left upper arm: Normal.       Arms:      Right hand: Normal.       Left hand: Normal.  TTP left trapezius, superior scapular and deltoid superior and latismus dorsi left midaxillary line; full arom; pain with abduction above shoulder level negative neers, empty beer can, atchley scratch, internal/external rotation bilaterally  Lymphadenopathy:       Head (right side): No submental, no submandibular, no tonsillar, no preauricular, no posterior auricular and no occipital adenopathy present.       Head (left side): No submental, no submandibular, no tonsillar, no preauricular, no posterior auricular and no occipital adenopathy present.    She has no cervical adenopathy.       Right cervical: No superficial cervical, no deep cervical and no posterior cervical adenopathy present.      Left cervical: No superficial cervical, no deep cervical and no posterior cervical adenopathy present.       Left axillary: No pectoral and no lateral adenopathy present. Neurological: She is alert and oriented to person, place, and time. She has normal strength. She displays no atrophy and no tremor. No cranial nerve deficit or sensory deficit. She exhibits normal muscle tone. She displays no seizure activity. Coordination and gait normal.  On/off exam table; in/out of chair without difficulty; gait sur e and steady in clinic  Skin: Skin is warm, dry and intact. No abrasion, no bruising, no burn, no ecchymosis, no laceration, no lesion, no petechiae and  no rash noted. She is not diaphoretic. No cyanosis or erythema. No pallor. Nails show no clubbing.  Psychiatric: She has a normal mood and affect. Her speech is normal and behavior is normal. Judgment and thought content normal. Cognition and memory are normal.  Nursing note and vitals reviewed.         Assessment & Plan:  A-left neck/shoulder strain initial visit acute  P-Rx motrin 800mg  po TID prn x 2 weeks #30 Rf0 dispensed from [facility] given biofreeze 4 UD packages from clinic stock apply TID prn pain given 1 reusable ice/heat back from clinic stock and applied 1 cervical theramcare patch to patient in clinic from stock.  Has tylenol at home discussed may use 1000mg  po QID prn pain also.  Demonstrated stretches and discussed ergonomics lifting/repetitive motion.  Home stretches demonstrated to patient-e.g. Arm circles, walking up wall, chest stretches, neck AROM, chin tucks, knee to chest and rock side to side on back.  Consider flexeril/ physical therapy referral if no improvement with prescribed therapy.  Ensure ergonomics correct desk at work avoid repetitive motions if possible/holding phone/laptop in hand use desk/stand.  Patient was instructed to rest, ice/heat 15 minutes QID, and ROM exercises.  Activity as tolerated.   Follow up if symptoms persist or worsen especially worsening pain and/or arm/hand weakness/paresthesias.  Exitcare handout on shoulder pain, muscle spasms and cervical/thoracic strain with rehab exercises given to patient.  Patient verbalized agreement and understanding of treatment plan.  P2:  Injury Prevention and Fitness.

## 2017-02-15 NOTE — Patient Instructions (Signed)
Muscle Strain A muscle strain is an injury that occurs when a muscle is stretched beyond its normal length. Usually a small number of muscle fibers are torn when this happens. Muscle strain is rated in degrees. First-degree strains have the least amount of muscle fiber tearing and pain. Second-degree and third-degree strains have increasingly more tearing and pain. Usually, recovery from muscle strain takes 1-2 weeks. Complete healing takes 5-6 weeks. What are the causes? Muscle strain happens when a sudden, violent force placed on a muscle stretches it too far. This may occur with lifting, sports, or a fall. What increases the risk? Muscle strain is especially common in athletes. What are the signs or symptoms? At the site of the muscle strain, there may be:  Pain.  Bruising.  Swelling.  Difficulty using the muscle due to pain or lack of normal function.  How is this diagnosed? Your health care provider will perform a physical exam and ask about your medical history. How is this treated? Often, the best treatment for a muscle strain is resting, icing, and applying cold compresses to the injured area. Follow these instructions at home:  Use the PRICE method of treatment to promote muscle healing during the first 2-3 days after your injury. The PRICE method involves: ? Protecting the muscle from being injured again. ? Restricting your activity and resting the injured body part. ? Icing your injury. To do this, put ice in a plastic bag. Place a towel between your skin and the bag. Then, apply the ice and leave it on from 15-20 minutes each hour. After the third day, switch to moist heat packs. ? Apply compression to the injured area with a splint or elastic bandage. Be careful not to wrap it too tightly. This may interfere with blood circulation or increase swelling. ? Elevate the injured body part above the level of your heart as often as you can.  Only take over-the-counter or  prescription medicines for pain, discomfort, or fever as directed by your health care provider.  Warming up prior to exercise helps to prevent future muscle strains. Contact a health care provider if:  You have increasing pain or swelling in the injured area.  You have numbness, tingling, or a significant loss of strength in the injured area. This information is not intended to replace advice given to you by your health care provider. Make sure you discuss any questions you have with your health care provider. Document Released: 07/24/2005 Document Revised: 12/30/2015 Document Reviewed: 02/20/2013 Elsevier Interactive Patient Education  2017 [employer]. Mid-Back Strain Rehab Ask your health care provider which exercises are safe for you. Do exercises exactly as told by your health care provider and adjust them as directed. It is normal to feel mild stretching, pulling, tightness, or discomfort as you do these exercises, but you should stop right away if you feel sudden pain or your pain gets worse. Do not begin these exercises until told by your health care provider. Stretching and range of motion exercises This exercise warms up your muscles and joints and improves the movement and flexibility of your back and shoulders. This exercise also help to relieve pain. Exercise A: Chest and spine stretch  1. Lie down on your back on a firm surface. 2. Roll a towel or a small blanket so it is about 4 inches (10 cm) in diameter. 3. Put the towel lengthwise under the middle of your back so it is under your spine, but not under your shoulder blades.  4. To increase the stretch, you may put your hands behind your head and let your elbows fall to your sides. 5. Hold for __________ seconds. Repeat exercise __________ times. Complete this exercise __________ times a day. Strengthening exercises These exercises build strength and endurance in your back and your shoulder blade muscles. Endurance is the  ability to use your muscles for a long time, even after they get tired. Exercise B: Alternating arm and leg raises  1. Get on your hands and knees on a firm surface. If you are on a hard floor, you may want to use padding to cushion your knees, such as an exercise mat. 2. Line up your arms and legs. Your hands should be below your shoulders, and your knees should be below your hips. 3. Lift your left leg behind you. At the same time, raise your right arm and straighten it in front of you. ? Do not lift your leg higher than your hip. ? Do not lift your arm higher than your shoulder. ? Keep your abdominal and back muscles tight. ? Keep your hips facing the ground. ? Do not arch your back. ? Keep your balance carefully, and do not hold your breath. 4. Hold for __________ seconds. 5. Slowly return to the starting position and repeat with your right leg and your left arm. Repeat __________ times. Complete this exercise __________ times a day. Exercise C: Straight arm rows ( shoulder extension) 1. Stand with your feet shoulder width apart. 2. Secure an exercise band to a stable object in front of you so the band is at or above shoulder height. 3. Hold one end of the exercise band in each hand. 4. Straighten your elbows and lift your hands up to shoulder height. 5. Step back, away from the secured end of the exercise band, until the band stretches. 6. Squeeze your shoulder blades together and pull your hands down to the sides of your thighs. Stop when your hands are straight down by your sides. Do not let your hands go behind your body. 7. Hold for __________ seconds. 8. Slowly return to the starting position. Repeat __________ times. Complete this exercise __________ times a day. Exercise D: Shoulder external rotation, prone 1. Lie on your abdomen on a firm bed so your left / right forearm hangs over the edge of the bed and your upper arm is on the bed, straight out from your body. ? Your elbow  should be bent. ? Your palm should be facing your feet. 2. If instructed, hold a __________ weight in your hand. 3. Squeeze your shoulder blade toward the middle of your back. Do not let your shoulder lift toward your ear. 4. Keep your elbow bent in an "L" shape (90 degrees) while you slowly move your forearm up toward the ceiling. Move your forearm up to the height of the bed, toward your head. ? Your upper arm should not move. ? At the top of the movement, your palm should face the floor. 5. Hold for __________ seconds. 6. Slowly return to the starting position and relax your muscles. Repeat __________ times. Complete this exercise __________ times a day. Exercise E: Scapular retraction and external rotation, rowing  1. Sit in a stable chair without armrests, or stand. 2. Secure an exercise band to a stable object in front of you so it is at shoulder height. 3. Hold one end of the exercise band in each hand. 4. Bring your arms out straight in front of you.  5. Step back, away from the secured end of the exercise band, until the band stretches. 6. Pull the band backward. As you do this, bend your elbows and squeeze your shoulder blades together, but avoid letting the rest of your body move. Do not let your shoulders lift up toward your ears. 7. Stop when your elbows are at your sides or slightly behind your body. 8. Hold for __________ seconds. 9. Slowly straighten your arms to return to the starting position. Repeat __________ times. Complete this exercise __________ times a day. Posture and body mechanics  Body mechanics refers to the movements and positions of your body while you do your daily activities. Posture is part of body mechanics. Good posture and healthy body mechanics can help to relieve stress in your body's tissues and joints. Good posture means that your spine is in its natural S-curve position (your spine is neutral), your shoulders are pulled back slightly, and your head is  not tipped forward. The following are general guidelines for applying improved posture and body mechanics to your everyday activities. Standing   When standing, keep your spine neutral and your feet about hip-width apart. Keep a slight bend in your knees. Your ears, shoulders, and hips should line up.  When you do a task in which you lean forward while standing in one place for a long time, place one foot up on a stable object that is 2-4 inches (5-10 cm) high, such as a footstool. This helps keep your spine neutral. Sitting   When sitting, keep your spine neutral and keep your feet flat on the floor. Use a footrest, if necessary, and keep your thighs parallel to the floor. Avoid rounding your shoulders, and avoid tilting your head forward.  When working at a desk or a computer, keep your desk at a height where your hands are slightly lower than your elbows. Slide your chair under your desk so you are close enough to maintain good posture.  When working at a computer, place your monitor at a height where you are looking straight ahead and you do not have to tilt your head forward or downward to look at the screen. Resting  When lying down and resting, avoid positions that are most painful for you.  If you have pain with activities such as sitting, bending, stooping, or squatting (flexion-based activities), lie in a position in which your body does not bend very much. For example, avoid curling up on your side with your arms and knees near your chest (fetal position).  If you have pain with activities such as standing for a long time or reaching with your arms (extension-based activities), lie with your spine in a neutral position and bend your knees slightly. Try the following positions:  Lying on your side with a pillow between your knees.  Lying on your back with a pillow under your knees.  Lifting   When lifting objects, keep your feet at least shoulder-width apart and tighten your  abdominal muscles.  Bend your knees and hips and keep your spine neutral. It is important to lift using the strength of your legs, not your back. Do not lock your knees straight out.  Always ask for help to lift heavy or awkward objects. This information is not intended to replace advice given to you by your health care provider. Make sure you discuss any questions you have with your health care provider. Document Released: 07/24/2005 Document Revised: 03/30/2016 Document Reviewed: 05/05/2015 Elsevier Interactive Patient Education  2018 [employer] Cervical Strain and Sprain Rehab Ask your health care provider which exercises are safe for you. Do exercises exactly as told by your health care provider and adjust them as directed. It is normal to feel mild stretching, pulling, tightness, or discomfort as you do these exercises, but you should stop right away if you feel sudden pain or your pain gets worse.Do not begin these exercises until told by your health care provider. Stretching and range of motion exercises These exercises warm up your muscles and joints and improve the movement and flexibility of your neck. These exercises also help to relieve pain, numbness, and tingling. Exercise A: Cervical side bend  1. Using good posture, sit on a stable chair or stand up. 2. Without moving your shoulders, slowly tilt your left / right ear to your shoulder until you feel a stretch in your neck muscles. You should be looking straight ahead. 3. Hold for __________ seconds. 4. Repeat with the other side of your neck. Repeat __________ times. Complete this exercise __________ times a day. Exercise B: Cervical rotation  1. Using good posture, sit on a stable chair or stand up. 2. Slowly turn your head to the side as if you are looking over your left / right shoulder. ? Keep your eyes level with the ground. ? Stop when you feel a stretch along the side and the back of your neck. 3. Hold for  __________ seconds. 4. Repeat this by turning to your other side. Repeat __________ times. Complete this exercise __________ times a day. Exercise C: Thoracic extension and pectoral stretch 1. Roll a towel or a small blanket so it is about 4 inches (10 cm) in diameter. 2. Lie down on your back on a firm surface. 3. Put the towel lengthwise, under your spine in the middle of your back. It should not be not under your shoulder blades. The towel should line up with your spine from your middle back to your lower back. 4. Put your hands behind your head and let your elbows fall out to your sides. 5. Hold for __________ seconds. Repeat __________ times. Complete this exercise __________ times a day. Strengthening exercises These exercises build strength and endurance in your neck. Endurance is the ability to use your muscles for a long time, even after your muscles get tired. Exercise D: Upper cervical flexion, isometric 1. Lie on your back with a thin pillow behind your head and a small rolled-up towel under your neck. 2. Gently tuck your chin toward your chest and nod your head down to look toward your feet. Do not lift your head off the pillow. 3. Hold for __________ seconds. 4. Release the tension slowly. Relax your neck muscles completely before you repeat this exercise. Repeat __________ times. Complete this exercise __________ times a day. Exercise E: Cervical extension, isometric  1. Stand about 6 inches (15 cm) away from a wall, with your back facing the wall. 2. Place a soft object, about 6-8 inches (15-20 cm) in diameter, between the back of your head and the wall. A soft object could be a small pillow, a ball, or a folded towel. 3. Gently tilt your head back and press into the soft object. Keep your jaw and forehead relaxed. 4. Hold for __________ seconds. 5. Release the tension slowly. Relax your neck muscles completely before you repeat this exercise. Repeat __________ times.  Complete this exercise __________ times a day. Posture and body mechanics  Body mechanics refers to the  movements and positions of your body while you do your daily activities. Posture is part of body mechanics. Good posture and healthy body mechanics can help to relieve stress in your body's tissues and joints. Good posture means that your spine is in its natural S-curve position (your spine is neutral), your shoulders are pulled back slightly, and your head is not tipped forward. The following are general guidelines for applying improved posture and body mechanics to your everyday activities. Standing  When standing, keep your spine neutral and keep your feet about hip-width apart. Keep a slight bend in your knees. Your ears, shoulders, and hips should line up.  When you do a task in which you stand in one place for a long time, place one foot up on a stable object that is 2-4 inches (5-10 cm) high, such as a footstool. This helps keep your spine neutral. Sitting   When sitting, keep your spine neutral and your keep feet flat on the floor. Use a footrest, if necessary, and keep your thighs parallel to the floor. Avoid rounding your shoulders, and avoid tilting your head forward.  When working at a desk or a computer, keep your desk at a height where your hands are slightly lower than your elbows. Slide your chair under your desk so you are close enough to maintain good posture.  When working at a computer, place your monitor at a height where you are looking straight ahead and you do not have to tilt your head forward or downward to look at the screen. Resting When lying down and resting, avoid positions that are most painful for you. Try to support your neck in a neutral position. You can use a contour pillow or a small rolled-up towel. Your pillow should support your neck but not push on it. This information is not intended to replace advice given to you by your health care provider. Make sure  you discuss any questions you have with your health care provider. Document Released: 07/24/2005 Document Revised: 03/30/2016 Document Reviewed: 06/30/2015 Elsevier Interactive Patient Education  2018 [employer]. Upper Arm and Shoulder Blade Stretch    Elbow bent, foreman toward chest, press uninvolved hand on back of involved upper arm stretching shoulder blade, and straighten elbow. Repeat ___ times. Do ___ times per day.  Copyright  [employer]. All rights reserved.

## 2017-03-01 ENCOUNTER — Ambulatory Visit: Payer: Self-pay | Admitting: *Deleted

## 2017-03-01 VITALS — BP 116/80 | HR 81 | Ht 64.5 in | Wt 161.0 lb

## 2017-03-01 DIAGNOSIS — Z0000 Encounter for general adult medical examination without abnormal findings: Secondary | ICD-10-CM

## 2017-03-01 NOTE — Progress Notes (Signed)
[employer] insurance premium discount evaluation: Labs Drawn. Replacements ROI form signed. Tobacco Free Attestation form signed.  Forms placed in paper chart.  Ok to route to PCP per pt.

## 2017-03-02 LAB — CMP12+LP+TP+TSH+6AC+CBC/D/PLT
A/G RATIO: 2 (ref 1.2–2.2)
ALT: 22 IU/L (ref 0–32)
AST: 32 IU/L (ref 0–40)
Albumin: 4.4 g/dL (ref 3.5–5.5)
Alkaline Phosphatase: 48 IU/L (ref 39–117)
BASOS ABS: 0 10*3/uL (ref 0.0–0.2)
BILIRUBIN TOTAL: 0.5 mg/dL (ref 0.0–1.2)
BUN/Creatinine Ratio: 20 (ref 9–23)
BUN: 13 mg/dL (ref 6–24)
Basos: 0 %
CHOL/HDL RATIO: 3 ratio (ref 0.0–4.4)
Calcium: 8.7 mg/dL (ref 8.7–10.2)
Chloride: 101 mmol/L (ref 96–106)
Cholesterol, Total: 136 mg/dL (ref 100–199)
Creatinine, Ser: 0.65 mg/dL (ref 0.57–1.00)
EOS (ABSOLUTE): 0.2 10*3/uL (ref 0.0–0.4)
Eos: 4 %
Estimated CHD Risk: <0.5 times avg. (ref 0.0–1.0)
Free Thyroxine Index: 1.5 (ref 1.2–4.9)
GFR, EST AFRICAN AMERICAN: 117 mL/min/{1.73_m2} (ref >59)
GFR, EST NON AFRICAN AMERICAN: 102 mL/min/{1.73_m2} (ref >59)
GGT: 16 IU/L (ref 0–60)
GLOBULIN, TOTAL: 2.2 g/dL (ref 1.5–4.5)
GLUCOSE: 96 mg/dL (ref 65–99)
HDL: 45 mg/dL (ref >39)
Hematocrit: 39.9 % (ref 34.0–46.6)
Hemoglobin: 12.8 g/dL (ref 11.1–15.9)
IMMATURE GRANS (ABS): 0 10*3/uL (ref 0.0–0.1)
Immature Granulocytes: 0 %
Iron: 54 ug/dL (ref 27–159)
LDH: 172 IU/L (ref 119–226)
LDL Calculated: 61 mg/dL (ref 0–99)
LYMPHS: 34 %
Lymphocytes Absolute: 1.4 10*3/uL (ref 0.7–3.1)
MCH: 27.7 pg (ref 26.6–33.0)
MCHC: 32.1 g/dL (ref 31.5–35.7)
MCV: 86 fL (ref 79–97)
MONOCYTES: 10 %
MONOS ABS: 0.4 10*3/uL (ref 0.1–0.9)
Neutrophils Absolute: 2.2 10*3/uL (ref 1.4–7.0)
Neutrophils: 52 %
PLATELETS: 234 10*3/uL (ref 150–379)
Phosphorus: 3.9 mg/dL (ref 2.5–4.5)
Potassium: 4.5 mmol/L (ref 3.5–5.2)
RBC: 4.62 x10E6/uL (ref 3.77–5.28)
RDW: 15.3 % (ref 12.3–15.4)
Sodium: 140 mmol/L (ref 134–144)
T3 UPTAKE RATIO: 24 % (ref 24–39)
T4, Total: 6.2 ug/dL (ref 4.5–12.0)
TRIGLYCERIDES: 150 mg/dL — AB (ref 0–149)
TSH: 1.28 u[IU]/mL (ref 0.450–4.500)
Total Protein: 6.6 g/dL (ref 6.0–8.5)
URIC ACID: 5.3 mg/dL (ref 2.5–7.1)
VLDL CHOLESTEROL CAL: 30 mg/dL (ref 5–40)
WBC: 4.2 10*3/uL (ref 3.4–10.8)

## 2017-03-02 LAB — HGB A1C W/O EAG: HEMOGLOBIN A1C: 6.2 % — AB (ref 4.8–5.6)

## 2017-03-02 NOTE — Progress Notes (Signed)
Results reviewed with pt. A1c elevated, same as previous. Triglycerides slightly elevated, likely related to glucose. MCH/MCHC/RDW all returned to [facility] from previous. All other labs unremarkable. Diet/exercise recommendations for improving A1c discussed. Continue metformin. F/u with PCP. Results routed to PCP per pt request.

## 2017-04-07 ENCOUNTER — Ambulatory Visit (INDEPENDENT_AMBULATORY_CARE_PROVIDER_SITE_OTHER): Payer: No Typology Code available for payment source | Admitting: Family Medicine

## 2017-04-07 VITALS — BP 138/78 | HR 77 | Temp 97.9°F | Resp 17 | Ht 64.0 in | Wt 158.4 lb

## 2017-04-07 DIAGNOSIS — Z1211 Encounter for screening for malignant neoplasm of colon: Secondary | ICD-10-CM

## 2017-04-07 DIAGNOSIS — E7800 Pure hypercholesterolemia, unspecified: Secondary | ICD-10-CM

## 2017-04-07 DIAGNOSIS — Z136 Encounter for screening for cardiovascular disorders: Secondary | ICD-10-CM

## 2017-04-07 DIAGNOSIS — Z130 Encounter for screening for diseases of the blood and blood-forming organs and certain disorders involving the immune mechanism: Secondary | ICD-10-CM | POA: Diagnosis not present

## 2017-04-07 DIAGNOSIS — Z1212 Encounter for screening for malignant neoplasm of rectum: Secondary | ICD-10-CM

## 2017-04-07 DIAGNOSIS — Z113 Encounter for screening for infections with a predominantly sexual mode of transmission: Secondary | ICD-10-CM

## 2017-04-07 DIAGNOSIS — Z119 Encounter for screening for infectious and parasitic diseases, unspecified: Secondary | ICD-10-CM

## 2017-04-07 DIAGNOSIS — Z1389 Encounter for screening for other disorder: Secondary | ICD-10-CM | POA: Diagnosis not present

## 2017-04-07 DIAGNOSIS — Z1329 Encounter for screening for other suspected endocrine disorder: Secondary | ICD-10-CM

## 2017-04-07 DIAGNOSIS — N898 Other specified noninflammatory disorders of vagina: Secondary | ICD-10-CM | POA: Diagnosis not present

## 2017-04-07 DIAGNOSIS — Z124 Encounter for screening for malignant neoplasm of cervix: Secondary | ICD-10-CM

## 2017-04-07 DIAGNOSIS — Z1383 Encounter for screening for respiratory disorder NEC: Secondary | ICD-10-CM | POA: Diagnosis not present

## 2017-04-07 DIAGNOSIS — E119 Type 2 diabetes mellitus without complications: Secondary | ICD-10-CM

## 2017-04-07 DIAGNOSIS — Z0000 Encounter for general adult medical examination without abnormal findings: Secondary | ICD-10-CM

## 2017-04-07 LAB — POCT URINALYSIS DIP (MANUAL ENTRY)
BILIRUBIN UA: NEGATIVE
Glucose, UA: NEGATIVE mg/dL
Nitrite, UA: NEGATIVE
PH UA: 7 (ref 5.0–8.0)
PROTEIN UA: NEGATIVE mg/dL
RBC UA: NEGATIVE
Spec Grav, UA: 1.015 (ref 1.010–1.025)
Urobilinogen, UA: 0.2 E.U./dL

## 2017-04-07 LAB — POCT WET + KOH PREP
TRICH BY WET PREP: ABSENT
YEAST BY KOH: ABSENT
YEAST BY WET PREP: ABSENT

## 2017-04-07 NOTE — Patient Instructions (Addendum)
[place] [id number] [phone]  [facility] [place], [address] [phone]    IF you received an x-ray today, you will receive an invoice from [facility]. Please contact [facility] at [phone] with questions or concerns regarding your invoice.   IF you received labwork today, you will receive an invoice from [facility]. Please contact [facility] at [phone] with questions or concerns regarding your invoice.   Our billing staff will not be able to assist you with questions regarding bills from these companies.  You will be contacted with the lab results as soon as they are available. The fastest way to get your results is to activate your My Chart account. Instructions are located on the last page of this paperwork. If you have not heard from [place] regarding the results in 2 weeks, please contact this office.     Bone Health Bones protect organs, store calcium, and anchor muscles. Good health habits, such as eating nutritious foods and exercising regularly, are important for maintaining healthy bones. They can also help to prevent a condition that causes bones to lose density and become weak and brittle (osteoporosis). Why is bone mass important? Bone mass refers to the amount of bone tissue that you have. The higher your bone mass, the stronger your bones. An important step toward having healthy bones throughout life is to have strong and dense bones during childhood. A young adult who has a high bone mass is more likely to have a high bone mass later in life. Bone mass at its greatest it is called peak bone mass. A large decline in bone mass occurs in older adults. In women, it occurs about the time of menopause. During this time, it is important to practice good health habits, because if more bone is lost than what is replaced, the bones will become less healthy and more likely to break (fracture). If you  find that you have a low bone mass, you may be able to prevent osteoporosis or further bone loss by changing your diet and lifestyle. How can I find out if my bone mass is low? Bone mass can be measured with an X-ray test that is called a bone mineral density (BMD) test. This test is recommended for all women who are age 32 or older. It may also be recommended for men who are age 76 or older, or for people who are more likely to develop osteoporosis due to:  Having bones that break easily.  Having a long-term disease that weakens bones, such as kidney disease or rheumatoid arthritis.  Having menopause earlier than normal.  Taking medicine that weakens bones, such as steroids, thyroid hormones, or hormone treatment for breast cancer or prostate cancer.  Smoking.  Drinking three or more alcoholic drinks each day.  What are the nutritional recommendations for healthy bones? To have healthy bones, you need to get enough of the right minerals and vitamins. Most nutrition experts recommend getting these nutrients from the foods that you eat. Nutritional recommendations vary from person to person. Ask your health care provider what is healthy for you. Here are some general guidelines. Calcium Recommendations Calcium is the most important (essential) mineral for bone health. Most people can get enough calcium from their diet, but supplements may be recommended for people who are at risk for osteoporosis. Good sources of calcium include:  Dairy products, such as low-fat or nonfat milk, cheese, and yogurt.  Dark green leafy  vegetables, such as [employer] and broccoli.  Calcium-fortified foods, such as orange juice, cereal, bread, soy beverages, and tofu products.  Nuts, such as almonds.  Follow these recommended amounts for daily calcium intake:  Children, age 85?3: 700 mg.  Children, age 58?8: 1,000 mg.  Children, age 858?13: 1,300 mg.  Teens, age 85?18: 1,300 mg.  Adults, age 69?50: 1,000  mg.  Adults, age 89?70: ? Men: 1,000 mg. ? Women: 1,200 mg.  Adults, age 61 or older: 1,200 mg.  Pregnant and breastfeeding females: ? Teens: 1,300 mg. ? Adults: 1,000 mg.  Vitamin D Recommendations Vitamin D is the most essential vitamin for bone health. It helps the body to absorb calcium. Sunlight stimulates the skin to make vitamin D, so be sure to get enough sunlight. If you live in a cold climate or you do not get outside often, your health care provider may recommend that you take vitamin D supplements. Good sources of vitamin D in your diet include:  Egg yolks.  Saltwater fish.  Milk and cereal fortified with vitamin D.  Follow these recommended amounts for daily vitamin D intake:  Children and teens, age 9?18: 28 international units.  Adults, age 72 or younger: 400-800 international units.  Adults, age 584 or older: 800-1,000 international units.  Other Nutrients Other nutrients for bone health include:  Phosphorus. This mineral is found in meat, poultry, dairy foods, nuts, and legumes. The recommended daily intake for adult men and adult women is 700 mg.  Magnesium. This mineral is found in seeds, nuts, dark green vegetables, and legumes. The recommended daily intake for adult men is 400?420 mg. For adult women, it is 310?320 mg.  Vitamin K. This vitamin is found in green leafy vegetables. The recommended daily intake is 120 mg for adult men and 90 mg for adult women.  What type of physical activity is best for building and maintaining healthy bones? Weight-bearing and strength-building activities are important for building and maintaining peak bone mass. Weight-bearing activities cause muscles and bones to work against gravity. Strength-building activities increases muscle strength that supports bones. Weight-bearing and muscle-building activities include:  Walking and hiking.  Jogging and running.  Dancing.  Gym exercises.  Lifting weights.  Tennis and  racquetball.  Climbing stairs.  Aerobics.  Adults should get at least 30 minutes of moderate physical activity on most days. Children should get at least 60 minutes of moderate physical activity on most days. Ask your health care provide what type of exercise is best for you. Where can I find more information? For more information, check out the following websites:  [place]: [employer].se  [employer] of Health: http://www.niams.AnonymousEar.fr.asp  This information is not intended to replace advice given to you by your health care provider. Make sure you discuss any questions you have with your health care provider. Document Released: 10/14/2003 Document Revised: 02/11/2016 Document Reviewed: 07/29/2014 Elsevier Interactive Patient Education  2018 [place] Maintenance, Female Adopting a healthy lifestyle and getting preventive care can go a long way to promote health and wellness. Talk with your health care provider about what schedule of regular examinations is right for you. This is a good chance for you to check in with your provider about disease prevention and staying healthy. In between checkups, there are plenty of things you can do on your own. Experts have done a lot of research about which lifestyle changes and preventive measures are most likely to keep you healthy. Ask your health care  provider for more information. Weight and diet Eat a healthy diet  Be sure to include plenty of vegetables, fruits, low-fat dairy products, and lean protein.  Do not eat a lot of foods high in solid fats, added sugars, or salt.  Get regular exercise. This is one of the most important things you can do for your health. ? Most adults should exercise for at least 150 minutes each week. The exercise should increase your heart rate and make you sweat (moderate-intensity exercise). ? Most adults  should also do strengthening exercises at least twice a week. This is in addition to the moderate-intensity exercise.  Maintain a healthy weight  Body mass index (BMI) is a measurement that can be used to identify possible weight problems. It estimates body fat based on height and weight. Your health care provider can help determine your BMI and help you achieve or maintain a healthy weight.  For females 80 years of age and older: ? A BMI below 18.5 is considered underweight. ? A BMI of 18.5 to 24.9 is normal. ? A BMI of 25 to 29.9 is considered overweight. ? A BMI of 30 and above is considered obese.  Watch levels of cholesterol and blood lipids  You should start having your blood tested for lipids and cholesterol at 53 years of age, then have this test every 5 years.  You may need to have your cholesterol levels checked more often if: ? Your lipid or cholesterol levels are high. ? You are older than 53 years of age. ? You are at high risk for heart disease.  Cancer screening Lung Cancer  Lung cancer screening is recommended for adults 90-28 years old who are at high risk for lung cancer because of a history of smoking.  A yearly low-dose CT scan of the lungs is recommended for people who: ? Currently smoke. ? Have quit within the past 15 years. ? Have at least a 30-pack-year history of smoking. A pack year is smoking an average of one pack of cigarettes a day for 1 year.  Yearly screening should continue until it has been 15 years since you quit.  Yearly screening should stop if you develop a health problem that would prevent you from having lung cancer treatment.  Breast Cancer  Practice breast self-awareness. This means understanding how your breasts normally appear and feel.  It also means doing regular breast self-exams. Let your health care provider know about any changes, no matter how small.  If you are in your 20s or 30s, you should have a clinical breast exam (CBE)  by a health care provider every 1-3 years as part of a regular health exam.  If you are 40 or older, have a CBE every year. Also consider having a breast X-ray (mammogram) every year.  If you have a family history of breast cancer, talk to your health care provider about genetic screening.  If you are at high risk for breast cancer, talk to your health care provider about having an MRI and a mammogram every year.  Breast cancer gene (BRCA) assessment is recommended for women who have family members with BRCA-related cancers. BRCA-related cancers include: ? Breast. ? Ovarian. ? Tubal. ? Peritoneal cancers.  Results of the assessment will determine the need for genetic counseling and BRCA1 and BRCA2 testing.  Cervical Cancer Your health care provider may recommend that you be screened regularly for cancer of the pelvic organs (ovaries, uterus, and vagina). This screening involves a pelvic examination,  including checking for microscopic changes to the surface of your cervix (Pap test). You may be encouraged to have this screening done every 3 years, beginning at age 34.  For women ages 68-65, health care providers may recommend pelvic exams and Pap testing every 3 years, or they may recommend the Pap and pelvic exam, combined with testing for human papilloma virus (HPV), every 5 years. Some types of HPV increase your risk of cervical cancer. Testing for HPV may also be done on women of any age with unclear Pap test results.  Other health care providers may not recommend any screening for nonpregnant women who are considered low risk for pelvic cancer and who do not have symptoms. Ask your health care provider if a screening pelvic exam is right for you.  If you have had past treatment for cervical cancer or a condition that could lead to cancer, you need Pap tests and screening for cancer for at least 20 years after your treatment. If Pap tests have been discontinued, your risk factors (such as  having a new sexual partner) need to be reassessed to determine if screening should resume. Some women have medical problems that increase the chance of getting cervical cancer. In these cases, your health care provider may recommend more frequent screening and Pap tests.  Colorectal Cancer  This type of cancer can be detected and often prevented.  Routine colorectal cancer screening usually begins at 53 years of age and continues through 53 years of age.  Your health care provider may recommend screening at an earlier age if you have risk factors for colon cancer.  Your health care provider may also recommend using home test kits to check for hidden blood in the stool.  A small camera at the end of a tube can be used to examine your colon directly (sigmoidoscopy or colonoscopy). This is done to check for the earliest forms of colorectal cancer.  Routine screening usually begins at age 71.  Direct examination of the colon should be repeated every 5-10 years through 53 years of age. However, you may need to be screened more often if early forms of precancerous polyps or small growths are found.  Skin Cancer  Check your skin from head to toe regularly.  Tell your health care provider about any new moles or changes in moles, especially if there is a change in a mole's shape or color.  Also tell your health care provider if you have a mole that is larger than the size of a pencil eraser.  Always use sunscreen. Apply sunscreen liberally and repeatedly throughout the day.  Protect yourself by wearing long sleeves, pants, a wide-brimmed hat, and sunglasses whenever you are outside.  Heart disease, diabetes, and high blood pressure  High blood pressure causes heart disease and increases the risk of stroke. High blood pressure is more likely to develop in: ? People who have blood pressure in the high end of the normal range (130-139/85-89 mm Hg). ? People who are overweight or  obese. ? People who are African American.  If you are 77-21 years of age, have your blood pressure checked every 3-5 years. If you are 44 years of age or older, have your blood pressure checked every year. You should have your blood pressure measured twice-once when you are at a hospital or clinic, and once when you are not at a hospital or clinic. Record the average of the two measurements. To check your blood pressure when you are not  at a hospital or clinic, you can use: ? An automated blood pressure machine at a pharmacy. ? A home blood pressure monitor.  If you are between 71 years and 74 years old, ask your health care provider if you should take aspirin to prevent strokes.  Have regular diabetes screenings. This involves taking a blood sample to check your fasting blood sugar level. ? If you are at a normal weight and have a low risk for diabetes, have this test once every three years after 53 years of age. ? If you are overweight and have a high risk for diabetes, consider being tested at a younger age or more often. Preventing infection Hepatitis B  If you have a higher risk for hepatitis B, you should be screened for this virus. You are considered at high risk for hepatitis B if: ? You were born in a country where hepatitis B is common. Ask your health care provider which countries are considered high risk. ? Your parents were born in a high-risk country, and you have not been immunized against hepatitis B (hepatitis B vaccine). ? You have HIV or AIDS. ? You use needles to inject street drugs. ? You live with someone who has hepatitis B. ? You have had sex with someone who has hepatitis B. ? You get hemodialysis treatment. ? You take certain medicines for conditions, including cancer, organ transplantation, and autoimmune conditions.  Hepatitis C  Blood testing is recommended for: ? Everyone born from [birth year] through 1967. ? Anyone with known risk factors for hepatitis  C.  Sexually transmitted infections (STIs)  You should be screened for sexually transmitted infections (STIs) including gonorrhea and chlamydia if: ? You are sexually active and are younger than 53 years of age. ? You are older than 53 years of age and your health care provider tells you that you are at risk for this type of infection. ? Your sexual activity has changed since you were last screened and you are at an increased risk for chlamydia or gonorrhea. Ask your health care provider if you are at risk.  If you do not have HIV, but are at risk, it may be recommended that you take a prescription medicine daily to prevent HIV infection. This is called pre-exposure prophylaxis (PrEP). You are considered at risk if: ? You are sexually active and do not regularly use condoms or know the HIV status of your partner(s). ? You take drugs by injection. ? You are sexually active with a partner who has HIV.  Talk with your health care provider about whether you are at high risk of being infected with HIV. If you choose to begin PrEP, you should first be tested for HIV. You should then be tested every 3 months for as long as you are taking PrEP. Pregnancy  If you are premenopausal and you may become pregnant, ask your health care provider about preconception counseling.  If you may become pregnant, take 400 to 800 micrograms (mcg) of folic acid every day.  If you want to prevent pregnancy, talk to your health care provider about birth control (contraception). Osteoporosis and menopause  Osteoporosis is a disease in which the bones lose minerals and strength with aging. This can result in serious bone fractures. Your risk for osteoporosis can be identified using a bone density scan.  If you are 61 years of age or older, or if you are at risk for osteoporosis and fractures, ask your health care provider if you should be  screened.  Ask your health care provider whether you should take a calcium or  vitamin D supplement to lower your risk for osteoporosis.  Menopause may have certain physical symptoms and risks.  Hormone replacement therapy may reduce some of these symptoms and risks. Talk to your health care provider about whether hormone replacement therapy is right for you. Follow these instructions at home:  Schedule regular health, dental, and eye exams.  Stay current with your immunizations.  Do not use any tobacco products including cigarettes, chewing tobacco, or electronic cigarettes.  If you are pregnant, do not drink alcohol.  If you are breastfeeding, limit how much and how often you drink alcohol.  Limit alcohol intake to no more than 1 drink per day for nonpregnant women. One drink equals 12 ounces of beer, 5 ounces of wine, or 1 ounces of hard liquor.  Do not use street drugs.  Do not share needles.  Ask your health care provider for help if you need support or information about quitting drugs.  Tell your health care provider if you often feel depressed.  Tell your health care provider if you have ever been abused or do not feel safe at home. This information is not intended to replace advice given to you by your health care provider. Make sure you discuss any questions you have with your health care provider. Document Released: 02/06/2011 Document Revised: 12/30/2015 Document Reviewed: 04/27/2015 Elsevier Interactive Patient Education  [employer].

## 2017-04-07 NOTE — Progress Notes (Addendum)
Subjective:  By signing my name below, I, [name], attest that this documentation has been prepared under the direction and in the presence of Delman Cheadle, MD. Electronically Signed: Moises Keller, [place]. 04/07/2017 , 8:47 AM .  Patient was seen in Room 10 .   Patient ID: [name], female    DOB: 21-Feb-1966, 51 y.o.   MRN: [id number] Chief Complaint  Patient presents with  . Annual Exam    CPE   HPI [name] is a 51 y.o. female who presents to Primary Care at [facility] for annual physical. Patient was seen 1 year prior for her CPE. She goes to her employee wellness clinic at [place] for her care during the year. She follows with gynecologist at [facility]. She receives annual flu vaccine at work. Labs were drawn at her work physical 1 month prior with hgbA1C of 6.2. She had normal CMP, normal iron, lipids at goal, thyroid and CBC, all normal.   DM type 2  She's on metformin 500mg  BID and tolerating well.  She has not seen the eye doctor in past year. Takes asa 81mg  qd. She does have a meter at home but the strips are to expensive.  Menses are still going on but are coming lighter but not skipping any months yet. Occasionally takes iron.  H/o fibroids and had pap every year until insurance change and now every 3 years - last was about 3 years ago.  She sees  Was seeing Dr. Ubaldo Glassing at [facility] until he retried and now seeing Dr. Ulanda Edison but as been 3 years - 06/15/14 - pap, [place] was nml then.   Primary Preventative Screenings: Cervical Cancer:  Family Planning: STI screening: Breast Cancer: Colorectal Cancer: Willing to do colonoscopy.  Tobacco use/EtOH/substances:  Bone Density: gets lots of calcium in her diet and occ vit D supp. Lots of lifting at work.  Cardiac: Weight/Keller sugar/Diet/Exercise: diet variable - occ eats to much; walks for exercise 3x/wk x 30 min OTC/Vit/Supp/Herbal: asa 81, occ iron supp Dentist/Optho: Immunizations:   Has a 53 yo and 62  yo granddaughters which keep her busy. Has 2 sons.  Chronic Medical Conditions: She does have occ pain in her left lateral shoulder worse when waking up in the morning.   Past Medical History:  Diagnosis Date  . Diabetes mellitus without complication ([place])    Prior to Admission medications   Medication Sig Start Date End Date Taking? Authorizing Provider  glucose Keller test strip Use as instructed 12/19/16   Betancourt, Aura Fey, NP  Lancets 28G MISC 1 Device by Does not apply route 4 (four) times daily as needed. 12/12/16   Betancourt, Aura Fey, NP  Menthol, Topical Analgesic, (BIOFREEZE) 4 % GEL Apply 1 Package topically 4 (four) times daily as needed. 02/15/17   Betancourt, Aura Fey, NP  metFORMIN (GLUCOPHAGE) 500 MG tablet Take 1 tablet (500 mg total) by mouth 2 (two) times daily with a meal. 01/08/16   Shawnee Knapp, MD  pravastatin (PRAVACHOL) 20 MG tablet Take 1 tablet (20 mg total) by mouth daily. 01/11/16   Shawnee Knapp, MD   No Known Allergies  Past Surgical History:  Procedure Laterality Date  . HERNIA REPAIR    . SPINE SURGERY  Disk surgery   Family History  Problem Relation Age of Onset  . Diabetes Father   . Breast cancer Neg Hx    Social History   Social History  . Marital status: Married  Spouse name: N/A  . Number of children: N/A  . Years of education: N/A   Social History Main Topics  . Smoking status: Never Smoker  . Smokeless tobacco: Not on file  . Alcohol use No  . Drug use: No  . Sexual activity: Not on file   Other Topics Concern  . Not on file   Social History Narrative  . No narrative on file   Depression screen [facility] 2/9 04/07/2017 01/08/2016 02/08/2015  Decreased Interest 0 0 0  Down, Depressed, Hopeless 0 0 0  PHQ - 2 Score 0 0 0    Review of Systems See hpi Left shoulder pain    Objective:   Physical Exam  Constitutional: She is oriented to person, place, and time. She appears well-developed and well-nourished. No distress.  HENT:  Head:  Normocephalic and atraumatic.  Right Ear: Tympanic membrane, external ear and ear canal normal.  Left Ear: Tympanic membrane, external ear and ear canal normal.  Nose: No mucosal edema or rhinorrhea.  Mouth/Throat: Uvula is midline, oropharynx is clear and moist and mucous membranes are normal. No posterior oropharyngeal erythema.  Eyes: Pupils are equal, round, and reactive to light. Conjunctivae and EOM are normal. Right eye exhibits no discharge. Left eye exhibits no discharge. No scleral icterus.  Neck: Normal range of motion. Neck supple. No thyromegaly present.  Cardiovascular: Normal rate, regular rhythm, normal heart sounds and intact distal pulses.   Pulmonary/Chest: Effort normal and breath sounds normal. No respiratory distress.  Abdominal: Soft. Bowel sounds are normal. There is no tenderness.  Genitourinary: Vagina normal and uterus normal. No breast swelling, tenderness, discharge or bleeding. Cervix exhibits no motion tenderness and no friability. Right adnexum displays no mass and no tenderness. Left adnexum displays no mass and no tenderness.  Genitourinary Comments: Unable to visualize posterior lip of cervix  Musculoskeletal: She exhibits no edema.  Lymphadenopathy:    She has no cervical adenopathy.  Neurological: She is alert and oriented to person, place, and time. She has normal reflexes.  Skin: Skin is warm and dry. She is not diaphoretic. No erythema.  Psychiatric: She has a normal mood and affect. Her behavior is normal.     Diabetic Foot Exam - Simple   Simple Foot Form Visual Inspection See comments:  Yes Sensation Testing Intact to touch and monofilament testing bilaterally:  Yes Pulse Check Posterior Tibialis and Dorsalis pulse intact bilaterally:  Yes Comments Pt did have some callus on the heels of both feet.  Pt did not have any trouble with feeling at any of the points.      BP 138/78   Pulse 77   Temp 97.9 F (36.6 C) (Oral)   Resp 17   Ht 5'  4" (1.626 m)   Wt 158 lb 6 oz (71.8 kg)   LMP 03/13/2017 (Exact Date)   SpO2 95%   BMI 27.19 kg/m      Assessment & Plan:  Refer to optho [place] Refer to [place] [phone] home phone; if doesn't answer - try son's phone Terrial Rhodes [phone]  Repeat pap in 1 yr since unable to fully visualize cervix  1. Annual physical exam   2. Screening for cardiovascular, respiratory, and genitourinary diseases   3. Screening for deficiency anemia   4. Screening for thyroid disorder   5. Screening for colorectal cancer   6. Pure hypercholesterolemia - cont on pravastatin 20  7. Controlled type 2 diabetes mellitus without complication, without long-term current use of insulin (  [place]) - well controlled on metformin 500 bid  8. Screening examination for infectious disease   9. Screening for cervical cancer   10. Routine screening for STI (sexually transmitted infection)   46. Vaginal discharge    Recheck in 1 yr.  Orders Placed This Encounter  Procedures  . Microalbumin/Creatinine Ratio, Urine  . HIV antibody  . HCV Ab w/Rflx to Verification  . POCT urinalysis dipstick  . POCT Wet + KOH Prep  . HM DIABETES FOOT EXAM    I personally performed the services described in this documentation, which was scribed in my presence. The recorded information has been reviewed and considered, and addended by me as needed.   Delman Cheadle, M.D.  Primary Care at [facility] [place], [place] [address] [phone] phone [phone] fax  04/08/17 3:12 AM  Results for orders placed or performed in visit on 04/07/17  POCT urinalysis dipstick  Result Value Ref Range   Color, UA yellow yellow   Clarity, UA clear clear   Glucose, UA negative negative mg/dL   Bilirubin, UA negative negative   Ketones, POC UA trace (5) (A) negative mg/dL   Spec Grav, UA 1.015 1.010 - 1.025   Keller, UA negative negative   pH, UA 7.0 5.0 - 8.0   Protein Ur, POC negative negative mg/dL    Urobilinogen, UA 0.2 0.2 or 1.0 E.U./dL   Nitrite, UA Negative Negative   Leukocytes, UA Trace (A) Negative  POCT Wet + KOH Prep  Result Value Ref Range   Yeast by KOH Absent Absent   Yeast by wet prep Absent Absent   WBC by wet prep Moderate (A) Few   Clue Cells Wet Prep HPF POC None None   Trich by wet prep Absent Absent   Bacteria Wet Prep HPF POC Few Few   Epithelial Cells By [employer] Pref ([facility]) Few None, Few, Too numerous to count   RBC,UR,HPF,POC None None RBC/hpf

## 2017-04-08 MED ORDER — METFORMIN HCL 500 MG PO TABS: 500.0000 mg | ORAL_TABLET | Freq: Two times a day (BID) | ORAL | 3 refills | Status: DC

## 2017-04-08 MED ORDER — PRAVASTATIN SODIUM 20 MG PO TABS: 20.0000 mg | ORAL_TABLET | Freq: Every day | ORAL | 3 refills | Status: DC

## 2017-04-10 ENCOUNTER — Encounter: Payer: Self-pay | Admitting: Gastroenterology

## 2017-04-10 LAB — HIV ANTIBODY (ROUTINE TESTING W REFLEX): HIV Screen 4th Generation wRfx: NONREACTIVE

## 2017-04-10 LAB — HCV INTERPRETATION

## 2017-04-10 LAB — MICROALBUMIN / CREATININE URINE RATIO
Creatinine, Urine: 182 mg/dL
MICROALB/CREAT RATIO: 8.2 mg/g{creat} (ref 0.0–30.0)
Microalbumin, Urine: 15 ug/mL

## 2017-04-10 LAB — HCV AB W/RFLX TO VERIFICATION: HCV Ab: <0.1 s/co ratio (ref 0.0–0.9)

## 2017-04-12 LAB — PAP IG, CT-NG, RFX HPV ASCU
Chlamydia, Nuc. Acid Amp: NEGATIVE
Gonococcus by Nucleic Acid Amp: NEGATIVE
PAP Smear Comment: 0

## 2017-04-18 ENCOUNTER — Ambulatory Visit (AMBULATORY_SURGERY_CENTER): Payer: Self-pay | Admitting: *Deleted

## 2017-04-18 VITALS — Ht 64.0 in | Wt 160.0 lb

## 2017-04-18 DIAGNOSIS — Z1211 Encounter for screening for malignant neoplasm of colon: Secondary | ICD-10-CM

## 2017-04-18 MED ORDER — NA SULFATE-K SULFATE-MG SULF 17.5-3.13-1.6 GM/177ML PO SOLN: ORAL | 0 refills | Status: DC

## 2017-04-18 NOTE — Progress Notes (Signed)
Interpreter at patient's side interpreting during PV today. Patient denies any allergies to eggs or soy. Patient denies any problems with anesthesia/sedation. Patient denies any oxygen use at home and does not take any diet/weight loss medications. EMMI education assisgned to patient on colonoscopy, this was explained and instructions given to patient.

## 2017-04-20 ENCOUNTER — Encounter: Payer: Self-pay | Admitting: Gastroenterology

## 2017-04-24 ENCOUNTER — Encounter: Payer: Self-pay | Admitting: Registered Nurse

## 2017-04-24 ENCOUNTER — Telehealth: Payer: Self-pay | Admitting: Registered Nurse

## 2017-04-24 NOTE — Telephone Encounter (Signed)
Patient requested fills of her metformin 500mg  po BID and simvastatin 40mg  po qhs at work pharmacy.  Need new Rx for her simvastatin 40mg  po qhs as last one greater than 53 year old (no more refills remaining).  Patient given bridge refill by occupational medicine clinic NP at [facility].  Please fax Rx to [phone].  Thank you for letting us partner in your patient's care.

## 2017-04-25 MED ORDER — SIMVASTATIN 40 MG PO TABS: 40.0000 mg | ORAL_TABLET | Freq: Every day | ORAL | 3 refills | Status: DC

## 2017-04-25 MED ORDER — METFORMIN HCL 500 MG PO TABS: 500.0000 mg | ORAL_TABLET | Freq: Two times a day (BID) | ORAL | 3 refills | Status: DC

## 2017-04-25 NOTE — Telephone Encounter (Signed)
Done

## 2017-04-26 NOTE — Telephone Encounter (Signed)
Rx for metformin and simvastatin faxed to [facility] [phone]

## 2017-04-30 ENCOUNTER — Encounter: Payer: Self-pay | Admitting: Gastroenterology

## 2017-04-30 ENCOUNTER — Ambulatory Visit (AMBULATORY_SURGERY_CENTER): Payer: No Typology Code available for payment source | Admitting: Gastroenterology

## 2017-04-30 VITALS — BP 105/64 | HR 76 | Temp 98.9°F | Resp 15 | Ht 64.0 in | Wt 160.0 lb

## 2017-04-30 DIAGNOSIS — D123 Benign neoplasm of transverse colon: Secondary | ICD-10-CM | POA: Diagnosis not present

## 2017-04-30 DIAGNOSIS — Z1211 Encounter for screening for malignant neoplasm of colon: Secondary | ICD-10-CM

## 2017-04-30 MED ORDER — SODIUM CHLORIDE 0.9 % IV SOLN: 500.0000 mL | INTRAVENOUS | Status: DC

## 2017-04-30 NOTE — Progress Notes (Signed)
Report given to PACU, vss 

## 2017-04-30 NOTE — Patient Instructions (Signed)
YOU HAD AN ENDOSCOPIC PROCEDURE TODAY AT THE [facility]:   Refer to the procedure report that was given to you for any specific questions about what was found during the examination.  If the procedure report does not answer your questions, please call your gastroenterologist to clarify.  If you requested that your care partner not be given the details of your procedure findings, then the procedure report has been included in a sealed envelope for you to review at your convenience later.  YOU SHOULD EXPECT: Some feelings of bloating in the abdomen. Passage of more gas than usual.  Walking can help get rid of the air that was put into your GI tract during the procedure and reduce the bloating. If you had a lower endoscopy (such as a colonoscopy or flexible sigmoidoscopy) you may notice spotting of blood in your stool or on the toilet paper. If you underwent a bowel prep for your procedure, you may not have a normal bowel movement for a few days.  Please Note:  You might notice some irritation and congestion in your nose or some drainage.  This is from the oxygen used during your procedure.  There is no need for concern and it should clear up in a day or so.  SYMPTOMS TO REPORT IMMEDIATELY:   Following lower endoscopy (colonoscopy or flexible sigmoidoscopy):  Excessive amounts of blood in the stool  Significant tenderness or worsening of abdominal pains  Swelling of the abdomen that is new, acute  Fever of 100F or higher  For urgent or emergent issues, a gastroenterologist can be reached at any hour by calling [phone].   DIET:  We do recommend a small meal at first, but then you may proceed to your regular diet.  Drink plenty of fluids but you should avoid alcoholic beverages for 24 hours.  ACTIVITY:  You should plan to take it easy for the rest of today and you should NOT DRIVE or use heavy machinery until tomorrow (because of the sedation medicines used during the test).     FOLLOW UP: Our staff will call the number listed on your records the next business day following your procedure to check on you and address any questions or concerns that you may have regarding the information given to you following your procedure. If we do not reach you, we will leave a message.  However, if you are feeling well and you are not experiencing any problems, there is no need to return our call.  We will assume that you have returned to your regular daily activities without incident.  If any biopsies were taken you will be contacted by phone or by letter within the next 1-3 weeks.  Please call us at [phone] if you have not heard about the biopsies in 3 weeks.   Await for biopsy results to determine next repeat Colonoscopy screening Polyps (handout given) Diverticulosis (handout given) Hemorrhoids (handout given)   SIGNATURES/CONFIDENTIALITY: You and/or your care partner have signed paperwork which will be entered into your electronic medical record.  These signatures attest to the fact that that the information above on your After Visit Summary has been reviewed and is understood.  Full responsibility of the confidentiality of this discharge information lies with you and/or your care-partner. 

## 2017-04-30 NOTE — Progress Notes (Signed)
Called to room to assist during endoscopic procedure.  Patient ID and intended procedure confirmed with present staff. Received instructions for my participation in the procedure from the performing physician.  

## 2017-04-30 NOTE — Op Note (Signed)
[facility] Patient Name: [name] Procedure Date: 04/30/2017 1:50 PM MRN: [id number] Endoscopist: Mauri Pole , MD Age: 51 Referring MD:  Date of Birth: 07/11/1966 Gender: Female Account #: [id number] Procedure:                Colonoscopy Indications:              Screening for colorectal malignant neoplasm Medicines:                Total IV Anesthesia (TIVA) Procedure:                Pre-Anesthesia Assessment:                           - Prior to the procedure, a History and Physical                            was performed, and patient medications and                            allergies were reviewed. The patient's tolerance of                            previous anesthesia was also reviewed. The risks                            and benefits of the procedure and the sedation                            options and risks were discussed with the patient.                            All questions were answered, and informed consent                            was obtained. Prior Anticoagulants: The patient has                            taken no previous anticoagulant or antiplatelet                            agents. ASA Grade Assessment: II - A patient with                            mild systemic disease. After reviewing the risks                            and benefits, the patient was deemed in                            satisfactory condition to undergo the procedure.                           After obtaining informed consent, the colonoscope  was passed under direct vision. Throughout the                            procedure, the patient's blood pressure, pulse, and                            oxygen saturations were monitored continuously. The                            Colonoscope was introduced through the anus and                            advanced to the the cecum, identified by                            appendiceal orifice  and ileocecal valve. The                            colonoscopy was performed without difficulty. The                            patient tolerated the procedure well. The quality                            of the bowel preparation was good. The ileocecal                            valve, appendiceal orifice, and rectum were                            photographed. Scope In: 1:57:56 PM Scope Out: 2:19:49 PM Scope Withdrawal Time: 0 hours 14 minutes 49 seconds  Total Procedure Duration: 0 hours 21 minutes 53 seconds  Findings:                 The perianal and digital rectal examinations were                            normal.                           Two sessile polyps were found in the transverse                            colon. The polyps were 11 to 15 mm in size. These                            polyps were removed with a cold snare. Resection                            and retrieval were complete.                           A few small-mouthed diverticula were found in the  sigmoid colon, ascending colon and cecum.                           Non-bleeding internal hemorrhoids were found during                            retroflexion. The hemorrhoids were medium-sized.                           Anal papilla(e) were hypertrophied. Complications:            No immediate complications. Estimated Blood Loss:     Estimated blood loss was minimal. Impression:               - Two 11 to 15 mm polyps in the transverse colon,                            removed with a cold snare. Resected and retrieved.                           - Diverticulosis in the sigmoid colon, in the                            ascending colon and in the cecum.                           - Non-bleeding internal hemorrhoids.                           - Anal papilla(e) were hypertrophied. Recommendation:           - Patient has a contact number available for                            emergencies. The  signs and symptoms of potential                            delayed complications were discussed with the                            patient. Return to normal activities tomorrow.                            Written discharge instructions were provided to the                            patient.                           - Resume previous diet.                           - Continue present medications.                           - Await pathology results.                           -  Repeat colonoscopy in 3 years for surveillance                            based on pathology results. Mauri Pole, MD 04/30/2017 2:26:59 PM This report has been signed electronically.

## 2017-05-01 ENCOUNTER — Telehealth: Payer: Self-pay | Admitting: *Deleted

## 2017-05-01 NOTE — Telephone Encounter (Signed)
  Follow up Call-  Call back number 04/30/2017  Post procedure Call Back phone  # [phone]  Permission to leave phone message Yes  Some recent data might be hidden     Patient questions:  Do you have a fever, pain , or abdominal swelling? No. Pain Score  0 *  Have you tolerated food without any problems? Yes.    Have you been able to return to your normal activities? Yes.    Do you have any questions about your discharge instructions: Diet   No. Medications  No. Follow up visit  No.  Do you have questions or concerns about your Care? No.  Actions: * If pain score is 4 or above: No action needed, pain <4.

## 2017-05-03 LAB — HM DIABETES EYE EXAM

## 2017-05-07 ENCOUNTER — Encounter: Payer: Self-pay | Admitting: Gastroenterology

## 2017-06-01 ENCOUNTER — Other Ambulatory Visit: Payer: Self-pay | Admitting: Family Medicine

## 2017-06-01 DIAGNOSIS — Z1231 Encounter for screening mammogram for malignant neoplasm of breast: Secondary | ICD-10-CM

## 2017-06-07 ENCOUNTER — Ambulatory Visit
Admission: RE | Admit: 2017-06-07 | Discharge: 2017-06-07 | Disposition: A | Discharge status: Home / Self Care | Payer: No Typology Code available for payment source | Source: Ambulatory Visit | Attending: Family Medicine | Admitting: Family Medicine

## 2017-06-07 DIAGNOSIS — Z1231 Encounter for screening mammogram for malignant neoplasm of breast: Secondary | ICD-10-CM | POA: Insufficient documentation

## 2017-10-02 ENCOUNTER — Telehealth: Payer: Self-pay | Admitting: Registered Nurse

## 2017-10-02 ENCOUNTER — Encounter: Payer: Self-pay | Admitting: Registered Nurse

## 2017-10-02 MED ORDER — LANCETS 28G MISC: 1.0000 | Freq: Four times a day (QID) | 3 refills | Status: AC | PRN

## 2017-10-02 MED ORDER — GLUCOSE BLOOD VI STRP: ORAL_STRIP | 12 refills | Status: DC

## 2017-10-02 NOTE — Telephone Encounter (Signed)
Patient contacted clinic and requested refills on her lancets and diabetic testing strips.  Patient has [employer] unsure if Next or VI.  Patient to take picture of machine or bring to pharmacy with her to ensure strips ordered will work with her machine.

## 2017-10-12 ENCOUNTER — Telehealth: Payer: Self-pay | Admitting: *Deleted

## 2017-10-12 DIAGNOSIS — E119 Type 2 diabetes mellitus without complications: Secondary | ICD-10-CM

## 2017-10-12 MED ORDER — ONETOUCH VERIO W/DEVICE KIT: 1.0000 | PACK | 0 refills | Status: AC

## 2017-10-12 MED ORDER — GLUCOSE BLOOD VI STRP: ORAL_STRIP | 12 refills | Status: DC

## 2017-10-12 NOTE — Telephone Encounter (Signed)
Noted and orders signed.  Agree with plan of care

## 2017-10-12 NOTE — Telephone Encounter (Signed)
RN spoke with [employer], pt's pharmacy insurance provider at pt request yesterday re: covered diabetes testing supplies. Original order for Contour test strips for pt's current meter was denied by insurance according to pharmacy tech RN spoke with.  [employer] reported to RN that OneTouch is the preferred brand for diabetes testing supplies. Rep would not release much information to RN despite being prescribing party, but did run a test claim and stated that a new OneTouch meter would cost the pt $11.xx with insurance coverage, but if she called in, they could assist her with getting a meter for free. Today, pt was given these options, as well as a manufacturer's offer of a free phone app connected meter if completed on website and through pcp. Pt chose to pay $11 copay. Did not wish to deal with calling insurance company.  Orders placed for new OneTouch Verio glucometer and strips per [employer] direction. Pt made aware and asked to let RN know if there is any issue getting Rx's or if co-pays are higher than expected. Pt verbalizes understanding and agreement. No further questions/concerns.

## 2017-11-27 ENCOUNTER — Encounter: Payer: Self-pay | Admitting: Registered Nurse

## 2017-11-27 ENCOUNTER — Ambulatory Visit: Payer: Self-pay | Admitting: Registered Nurse

## 2017-11-27 VITALS — BP 123/75 | HR 97 | Temp 98.6°F

## 2017-11-27 DIAGNOSIS — J209 Acute bronchitis, unspecified: Secondary | ICD-10-CM

## 2017-11-27 DIAGNOSIS — J0121 Acute recurrent ethmoidal sinusitis: Secondary | ICD-10-CM

## 2017-11-27 DIAGNOSIS — H6691 Otitis media, unspecified, right ear: Secondary | ICD-10-CM

## 2017-11-27 MED ORDER — SALINE SPRAY 0.65 % NA SOLN
2.0000 | NASAL | 0 refills | Status: DC
Start: 2017-11-27 — End: 2018-05-09

## 2017-11-27 MED ORDER — BENZONATATE 200 MG PO CAPS: 200.0000 mg | ORAL_CAPSULE | Freq: Two times a day (BID) | ORAL | 0 refills | Status: AC | PRN

## 2017-11-27 MED ORDER — ACETAMINOPHEN 500 MG PO TABS: 1000.0000 mg | ORAL_TABLET | Freq: Four times a day (QID) | ORAL | 0 refills | Status: AC | PRN

## 2017-11-27 MED ORDER — AMOXICILLIN-POT CLAVULANATE 875-125 MG PO TABS: 1.0000 | ORAL_TABLET | Freq: Two times a day (BID) | ORAL | 0 refills | Status: AC

## 2017-11-27 MED ORDER — CETIRIZINE HCL 10 MG PO TABS: 10.0000 mg | ORAL_TABLET | Freq: Every day | ORAL | 11 refills | Status: DC

## 2017-11-27 MED ORDER — FLUTICASONE PROPIONATE 50 MCG/ACT NA SUSP: 1.0000 | Freq: Two times a day (BID) | NASAL | 6 refills | Status: DC

## 2017-11-27 MED ORDER — ALBUTEROL SULFATE HFA 108 (90 BASE) MCG/ACT IN AERS: 1.0000 | INHALATION_SPRAY | RESPIRATORY_TRACT | 0 refills | Status: DC | PRN

## 2017-11-27 NOTE — Patient Instructions (Signed)
Allergic Rhinitis, Adult Allergic rhinitis is an allergic reaction that affects the mucous membrane inside the nose. It causes sneezing, a runny or stuffy nose, and the feeling of mucus going down the back of the throat (postnasal drip). Allergic rhinitis can be mild to severe. There are two types of allergic rhinitis:  Seasonal. This type is also called hay fever. It happens only during certain seasons.  Perennial. This type can happen at any time of the year.  What are the causes? This condition happens when the body's defense system (immune system) responds to certain harmless substances called allergens as though they were germs.  Seasonal allergic rhinitis is triggered by pollen, which can come from grasses, trees, and weeds. Perennial allergic rhinitis may be caused by:  House dust mites.  Pet dander.  Mold spores.  What are the signs or symptoms? Symptoms of this condition include:  Sneezing.  Runny or stuffy nose (nasal congestion).  Postnasal drip.  Itchy nose.  Tearing of the eyes.  Trouble sleeping.  Daytime sleepiness.  How is this diagnosed? This condition may be diagnosed based on:  Your medical history.  A physical exam.  Tests to check for related conditions, such as: ? Asthma. ? Pink eye. ? Ear infection. ? Upper respiratory infection.  Tests to find out which allergens trigger your symptoms. These may include skin or blood tests.  How is this treated? There is no cure for this condition, but treatment can help control symptoms. Treatment may include:  Taking medicines that block allergy symptoms, such as antihistamines. Medicine may be given as a shot, nasal spray, or pill.  Avoiding the allergen.  Desensitization. This treatment involves getting ongoing shots until your body becomes less sensitive to the allergen. This treatment may be done if other treatments do not help.  If taking medicine and avoiding the allergen does not work, new,  stronger medicines may be prescribed.  Follow these instructions at home:  Find out what you are allergic to. Common allergens include smoke, dust, and pollen.  Avoid the things you are allergic to. These are some things you can do to help avoid allergens: ? Replace carpet with wood, tile, or vinyl flooring. Carpet can trap dander and dust. ? Do not smoke. Do not allow smoking in your home. ? Change your heating and air conditioning filter at least once a month. ? During allergy season:  Keep windows closed as much as possible.  Plan outdoor activities when pollen counts are lowest. This is usually during the evening hours.  When coming indoors, change clothing and shower before sitting on furniture or bedding.  Take over-the-counter and prescription medicines only as told by your health care provider.  Keep all follow-up visits as told by your health care provider. This is important. Contact a health care provider if:  You have a fever.  You develop a persistent cough.  You make whistling sounds when you breathe (you wheeze).  Your symptoms interfere with your normal daily activities. Get help right away if:  You have shortness of breath. Summary  This condition can be managed by taking medicines as directed and avoiding allergens.  Contact your health care provider if you develop a persistent cough or fever.  During allergy season, keep windows closed as much as possible. This information is not intended to replace advice given to you by your health care provider. Make sure you discuss any questions you have with your health care provider. Document Released: 04/18/2001 Document Revised: 08/31/2016  Document Reviewed: 08/31/2016 Elsevier Interactive Patient Education  2018 [employer]. Otitis Media, Adult Otitis media occurs when there is inflammation and fluid in the middle ear. Your middle ear is a part of the ear that contains bones for hearing as well as air that  helps send sounds to your brain. What are the causes? This condition is caused by a blockage in the eustachian tube. This tube drains fluid from the ear to the back of the nose (nasopharynx). A blockage in this tube can be caused by an object or by swelling (edema) in the tube. Problems that can cause a blockage include:  A cold or other upper respiratory infection.  Allergies.  An irritant, such as tobacco smoke.  Enlarged adenoids. The adenoids are areas of soft tissue located high in the back of the throat, behind the nose and the roof of the mouth.  A mass in the nasopharynx.  Damage to the ear caused by pressure changes (barotrauma).  What are the signs or symptoms? Symptoms of this condition include:  Ear pain.  A fever.  Decreased hearing.  A headache.  Tiredness (lethargy).  Fluid leaking from the ear.  Ringing in the ear.  How is this diagnosed? This condition is diagnosed with a physical exam. During the exam your health care provider will use an instrument called an otoscope to look into your ear and check for redness, swelling, and fluid. He or she will also ask about your symptoms. Your health care provider may also order tests, such as:  A test to check the movement of the eardrum (pneumatic otoscopy). This test is done by squeezing a small amount of air into the ear.  A test that changes air pressure in the middle ear to check how well the eardrum moves and whether the eustachian tube is working (tympanogram).  How is this treated? This condition usually goes away on its own within 3-5 days. But if the condition is caused by a bacteria infection and does not go away own its own, or keeps coming back, your health care provider may:  Prescribe antibiotic medicines to treat the infection.  Prescribe or recommend medicines to control pain.  Follow these instructions at home:  Take over-the-counter and prescription medicines only as told by your health care  provider.  If you were prescribed an antibiotic medicine, take it as told by your health care provider. Do not stop taking the antibiotic even if you start to feel better.  Keep all follow-up visits as told by your health care provider. This is important. Contact a health care provider if:  You have bleeding from your nose.  There is a lump on your neck.  You are not getting better in 5 days.  You feel worse instead of better. Get help right away if:  You have severe pain that is not controlled with medicine.  You have swelling, redness, or pain around your ear.  You have stiffness in your neck.  A part of your face is paralyzed.  The bone behind your ear (mastoid) is tender when you touch it.  You develop a severe headache. Summary  Otitis media is redness, soreness, and swelling of the middle ear.  This condition usually goes away on its own within 3-5 days.  If the problem does not go away in 3-5 days, your health care provider may prescribe or recommend medicines to treat your symptoms.  If you were prescribed an antibiotic medicine, take it as told by your  health care provider. This information is not intended to replace advice given to you by your health care provider. Make sure you discuss any questions you have with your health care provider. Document Released: 04/28/2004 Document Revised: 07/14/2016 Document Reviewed: 07/14/2016 Elsevier Interactive Patient Education  2018 [employer]. Nonallergic Rhinitis  1. Nonallergic rhinitis is a term used by allergist to describe inflammation in the nose that is not due to an allergic source (i.e., pollens, mold, animal dander or dust mites). 2. Nonallergic rhinitis can mimic many of the symptoms caused by allergies.  This includes a runny nose ("rhinorrhea"), sneezing, congestion, ear fullness and post nasal drip. 3. These symptoms usually occur year round but can be made worse by many environmental factors including  weather change, irritants such as cigarette smoke, fumes, perfumes, detergents and many others. These are not allergens, but are irritants, and do not cause the formation of antibodies like true allergens.  For this reason most people with nonallergic rhinitis have negative skin tests. 4. Unfortunately, we have a poor understanding of what causes nonallergic rhinitis but certain factors such as deviated septum, sinusitis and nasal polyps may contribute to the symptoms.  Due to the poor understanding of what causes nonallergic rhinitis it cannot be cured and our treatment is only symptomatic to control symptoms. 5. Important factors in the successful treatment of nonallergic rhinitis include avoidance of the irritants that cause symptoms; for example, people who continue to smoke may never improve.  Continuous therapy works better than intermittent.  This may mean taking medications once daily or 3-4 times a day. Helpful treatments include nasal saline lavage, nasal ipratropium (Atrovent), nasal steroids, and decongestants.  Pure antihistamines don't seem to work very well.  Immunotherapy (allergy shots) has no role in the treatment of nonallergic rhinitis.  Several medications may have to be tried before a good combination is found for you.  These medications, in general, are safe for long term use.  Tolerance may develop to the therapeutic effects of these medications: Frequently changing between several helpful medications can prevent this should it occur.How to Use a Metered Dose Inhaler A metered dose inhaler is a handheld device for taking medicine that must be breathed into the lungs (inhaled). The device can be used to deliver a variety of inhaled medicines, including:  Quick relief or rescue medicines, such as bronchodilators.  Controller medicines, such as corticosteroids.  The medicine is delivered by pushing down on a metal canister to release a preset amount of spray and medicine. Each device  contains the amount of medicine that is needed for a preset number of uses (inhalations). Your health care provider may recommend that you use a spacer with your inhaler to help you take the medicine more effectively. A spacer is a plastic tube with a mouthpiece on one end and an opening that connects to the inhaler on the other end. A spacer holds the medicine in a tube for a short time, which allows you to inhale more medicine. What are the risks? If you do not use your inhaler correctly, medicine might not reach your lungs to help you breathe. Inhaler medicine can cause side effects, such as:  Mouth or throat infection.  Cough.  Hoarseness.  Headache.  Nausea and vomiting.  Lung infection (pneumonia) in people who have a lung condition called COPD.  How to use a metered dose inhaler without a spacer 1. Remove the cap from the inhaler. 2. If you are using the inhaler for the first  time, shake it for 5 seconds, turn it away from your face, then release 4 puffs into the air. This is called priming. 3. Shake the inhaler for 5 seconds. 4. Position the inhaler so the top of the canister faces up. 5. Put your index finger on the top of the medicine canister. Support the bottom of the inhaler with your thumb. 6. Breathe out normally and as completely as possible, away from the inhaler. 7. Either place the inhaler between your teeth and close your lips tightly around the mouthpiece, or hold the inhaler 1-2 inches (2.5-5 cm) away from your open mouth. Keep your tongue down out of the way. If you are unsure which technique to use, ask your health care provider. 8. Press the canister down with your index finger to release the medicine, then inhale deeply and slowly through your mouth (not your nose) until your lungs are completely filled. Inhaling should take 4-6 seconds. 9. Hold the medicine in your lungs for 5-10 seconds (10 seconds is best). This helps the medicine get into the small airways of  your lungs. 10. With your lips in a tight circle (pursed), breathe out slowly. 11. Repeat steps 3-10 until you have taken the number of puffs that your health care provider directed. Wait about 1 minute between puffs or as directed. 12. Put the cap on the inhaler. 13. If you are using a steroid inhaler, rinse your mouth with water, gargle, and spit out the water. Do not swallow the water. How to use a metered dose inhaler with a spacer 1. Remove the cap from the inhaler. 2. If you are using the inhaler for the first time, shake it for 5 seconds, turn it away from your face, then release 4 puffs into the air. This is called priming. 3. Shake the inhaler for 5 seconds. 4. Place the open end of the spacer onto the inhaler mouthpiece. 5. Position the inhaler so the top of the canister faces up and the spacer mouthpiece faces you. 6. Put your index finger on the top of the medicine canister. Support the bottom of the inhaler and the spacer with your thumb. 7. Breathe out normally and as completely as possible, away from the spacer. 8. Place the spacer between your teeth and close your lips tightly around it. Keep your tongue down out of the way. 9. Press the canister down with your index finger to release the medicine, then inhale deeply and slowly through your mouth (not your nose) until your lungs are completely filled. Inhaling should take 4-6 seconds. 10. Hold the medicine in your lungs for 5-10 seconds (10 seconds is best). This helps the medicine get into the small airways of your lungs. 11. With your lips in a tight circle (pursed), breathe out slowly. 12. Repeat steps 3-11 until you have taken the number of puffs that your health care provider directed. Wait about 1 minute between puffs or as directed. 13. Remove the spacer from the inhaler and put the cap on the inhaler. 14. If you are using a steroid inhaler, rinse your mouth with water, gargle, and spit out the water. Do not swallow the  water. Follow these instructions at home:  Take your inhaled medicine only as told by your health care provider. Do not use the inhaler more than directed by your health care provider.  Keep all follow-up visits as told by your health care provider. This is important.  If your inhaler has a counter, you can check it  to determine how full your inhaler is. If your inhaler does not have a counter, ask your health care provider when you will need to refill your inhaler and write the refill date on a calendar or on your inhaler canister. Note that you cannot know when an inhaler is empty by shaking it.  Follow directions on the package insert for care and cleaning of your inhaler and spacer. Contact a health care provider if:  Symptoms are only partially relieved with your inhaler.  You are having trouble using your inhaler.  You have an increase in phlegm.  You have headaches. Get help right away if:  You feel little or no relief after using your inhaler.  You have dizziness.  You have a fast heart rate.  You have chills or a fever.  You have night sweats.  There is blood in your phlegm. Summary  A metered dose inhaler is a handheld device for taking medicine that must be breathed into the lungs (inhaled).  The medicine is delivered by pushing down on a metal canister to release a preset amount of spray and medicine.  Each device contains the amount of medicine that is needed for a preset number of uses (inhalations). This information is not intended to replace advice given to you by your health care provider. Make sure you discuss any questions you have with your health care provider. Document Released: 07/24/2005 Document Revised: 06/13/2016 Document Reviewed: 06/13/2016 Elsevier Interactive Patient Education  2017 [employer] Sinus Rinse What is a sinus rinse? A sinus rinse is a simple home treatment that is used to rinse your sinuses with a sterile mixture of salt and  water (saline solution). Sinuses are air-filled spaces in your skull behind the bones of your face and forehead that open into your nasal cavity. You will use the following:  Saline solution.  Neti pot or spray bottle. This releases the saline solution into your nose and through your sinuses. Neti pots and spray bottles can be purchased at [employer], a health food store, or online.  When would I do a sinus rinse? A sinus rinse can help to clear mucus, dirt, dust, or pollen from the nasal cavity. You may do a sinus rinse when you have a cold, a virus, nasal allergy symptoms, a sinus infection, or stuffiness in the nose or sinuses. If you are considering a sinus rinse:  Ask your child's health care provider before performing a sinus rinse on your child.  Do not do a sinus rinse if you have had ear or nasal surgery, ear infection, or blocked ears.  How do I do a sinus rinse?  Wash your hands.  Disinfect your device according to the directions provided and then dry it.  Use the solution that comes with your device or one that is sold separately in stores. Follow the mixing directions on the package.  Fill your device with the amount of saline solution as directed by the device instructions.  Stand over a sink and tilt your head sideways over the sink.  Place the spout of the device in your upper nostril (the one closer to the ceiling).  Gently pour or squeeze the saline solution into the nasal cavity. The liquid should drain to the lower nostril if you are not overly congested.  Gently blow your nose. Blowing too hard may cause ear pain.  Repeat in the other nostril.  Clean and rinse your device with clean water and then air-dry it. Are there  risks of a sinus rinse? Sinus rinse is generally very safe and effective. However, there are a few risks, which include:  A burning sensation in the sinuses. This may happen if you do not make the saline solution as directed. Make  sure to follow all directions when making the saline solution.  Infection from contaminated water. This is rare, but possible.  Nasal irritation.  This information is not intended to replace advice given to you by your health care provider. Make sure you discuss any questions you have with your health care provider. Document Released: 02/18/2014 Document Revised: 06/20/2016 Document Reviewed: 12/09/2013 Elsevier Interactive Patient Education  2017 [employer] Sinusitis, Adult Sinusitis is soreness and inflammation of your sinuses. Sinuses are hollow spaces in the bones around your face. Your sinuses are located:  Around your eyes.  In the middle of your forehead.  Behind your nose.  In your cheekbones.  Your sinuses and nasal passages are lined with a stringy fluid (mucus). Mucus normally drains out of your sinuses. When your nasal tissues become inflamed or swollen, the mucus can become trapped or blocked so air cannot flow through your sinuses. This allows bacteria, viruses, and funguses to grow, which leads to infection. Sinusitis can develop quickly and last for 7?10 days (acute) or for more than 12 weeks (chronic). Sinusitis often develops after a cold. What are the causes? This condition is caused by anything that creates swelling in the sinuses or stops mucus from draining, including:  Allergies.  Asthma.  Bacterial or viral infection.  Abnormally shaped bones between the nasal passages.  Nasal growths that contain mucus (nasal polyps).  Narrow sinus openings.  Pollutants, such as chemicals or irritants in the air.  A foreign object stuck in the nose.  A fungal infection. This is rare.  What increases the risk? The following factors may make you more likely to develop this condition:  Having allergies or asthma.  Having had a recent cold or respiratory tract infection.  Having structural deformities or blockages in your nose or sinuses.  Having a weak  immune system.  Doing a lot of swimming or diving.  Overusing nasal sprays.  Smoking.  What are the signs or symptoms? The main symptoms of this condition are pain and a feeling of pressure around the affected sinuses. Other symptoms include:  Upper toothache.  Earache.  Headache.  Bad breath.  Decreased sense of smell and taste.  A cough that may get worse at night.  Fatigue.  Fever.  Thick drainage from your nose. The drainage is often green and it may contain pus (purulent).  Stuffy nose or congestion.  Postnasal drip. This is when extra mucus collects in the throat or back of the nose.  Swelling and warmth over the affected sinuses.  Sore throat.  Sensitivity to light.  How is this diagnosed? This condition is diagnosed based on symptoms, a medical history, and a physical exam. To find out if your condition is acute or chronic, your health care provider may:  Look in your nose for signs of nasal polyps.  Tap over the affected sinus to check for signs of infection.  View the inside of your sinuses using an imaging device that has a light attached (endoscope).  If your health care provider suspects that you have chronic sinusitis, you may also:  Be tested for allergies.  Have a sample of mucus taken from your nose (nasal culture) and checked for bacteria.  Have a mucus sample examined to  see if your sinusitis is related to an allergy.  If your sinusitis does not respond to treatment and it lasts longer than 8 weeks, you may have an MRI or CT scan to check your sinuses. These scans also help to determine how severe your infection is. In rare cases, a bone biopsy may be done to rule out more serious types of fungal sinus disease. How is this treated? Treatment for sinusitis depends on the cause and whether your condition is chronic or acute. If a virus is causing your sinusitis, your symptoms will go away on their own within 10 days. You may be given  medicines to relieve your symptoms, including:  Topical nasal decongestants. They shrink swollen nasal passages and let mucus drain from your sinuses.  Antihistamines. These drugs block inflammation that is triggered by allergies. This can help to ease swelling in your nose and sinuses.  Topical nasal corticosteroids. These are nasal sprays that ease inflammation and swelling in your nose and sinuses.  Nasal saline washes. These rinses can help to get rid of thick mucus in your nose.  If your condition is caused by bacteria, you will be given an antibiotic medicine. If your condition is caused by a fungus, you will be given an antifungal medicine. Surgery may be needed to correct underlying conditions, such as narrow nasal passages. Surgery may also be needed to remove polyps. Follow these instructions at home: Medicines  Take, use, or apply over-the-counter and prescription medicines only as told by your health care provider. These may include nasal sprays.  If you were prescribed an antibiotic medicine, take it as told by your health care provider. Do not stop taking the antibiotic even if you start to feel better. Hydrate and Humidify  Drink enough water to keep your urine clear or pale yellow. Staying hydrated will help to thin your mucus.  Use a cool mist humidifier to keep the humidity level in your home above 50%.  Inhale steam for 10-15 minutes, 3-4 times a day or as told by your health care provider. You can do this in the bathroom while a hot shower is running.  Limit your exposure to cool or dry air. Rest  Rest as much as possible.  Sleep with your head raised (elevated).  Make sure to get enough sleep each night. General instructions  Apply a warm, moist washcloth to your face 3-4 times a day or as told by your health care provider. This will help with discomfort.  Wash your hands often with soap and water to reduce your exposure to viruses and other germs. If soap  and water are not available, use hand sanitizer.  Do not smoke. Avoid being around people who are smoking (secondhand smoke).  Keep all follow-up visits as told by your health care provider. This is important. Contact a health care provider if:  You have a fever.  Your symptoms get worse.  Your symptoms do not improve within 10 days. Get help right away if:  You have a severe headache.  You have persistent vomiting.  You have pain or swelling around your face or eyes.  You have vision problems.  You develop confusion.  Your neck is stiff.  You have trouble breathing. This information is not intended to replace advice given to you by your health care provider. Make sure you discuss any questions you have with your health care provider. Document Released: 07/24/2005 Document Revised: 03/19/2016 Document Reviewed: 05/19/2015 Elsevier Interactive Patient Education  2018 [employer] Acute Bronchitis, Adult Acute bronchitis is sudden (acute) swelling of the air tubes (bronchi) in the lungs. Acute bronchitis causes these tubes to fill with mucus, which can make it hard to breathe. It can also cause coughing or wheezing. In adults, acute bronchitis usually goes away within 2 weeks. A cough caused by bronchitis may last up to 3 weeks. Smoking, allergies, and asthma can make the condition worse. Repeated episodes of bronchitis may cause further lung problems, such as chronic obstructive pulmonary disease (COPD). What are the causes? This condition can be caused by germs and by substances that irritate the lungs, including:  Cold and flu viruses. This condition is most often caused by the same virus that causes a cold.  Bacteria.  Exposure to tobacco smoke, dust, fumes, and air pollution.  What increases the risk? This condition is more likely to develop in people who:  Have close contact with someone with acute bronchitis.  Are exposed to lung irritants, such as tobacco smoke,  dust, fumes, and vapors.  Have a weak immune system.  Have a respiratory condition such as asthma.  What are the signs or symptoms? Symptoms of this condition include:  A cough.  Coughing up clear, yellow, or green mucus.  Wheezing.  Chest congestion.  Shortness of breath.  A fever.  Body aches.  Chills.  A sore throat.  How is this diagnosed? This condition is usually diagnosed with a physical exam. During the exam, your health care provider may order tests, such as chest X-rays, to rule out other conditions. He or she may also:  Test a sample of your mucus for bacterial infection.  Check the level of oxygen in your blood. This is done to check for pneumonia.  Do a chest X-ray or lung function testing to rule out pneumonia and other conditions.  Perform blood tests.  Your health care provider will also ask about your symptoms and medical history. How is this treated? Most cases of acute bronchitis clear up over time without treatment. Your health care provider may recommend:  Drinking more fluids. Drinking more makes your mucus thinner, which may make it easier to breathe.  Taking a medicine for a fever or cough.  Taking an antibiotic medicine.  Using an inhaler to help improve shortness of breath and to control a cough.  Using a cool mist vaporizer or humidifier to make it easier to breathe.  Follow these instructions at home: Medicines  Take over-the-counter and prescription medicines only as told by your health care provider.  If you were prescribed an antibiotic, take it as told by your health care provider. Do not stop taking the antibiotic even if you start to feel better. General instructions  Get plenty of rest.  Drink enough fluids to keep your urine clear or pale yellow.  Avoid smoking and secondhand smoke. Exposure to cigarette smoke or irritating chemicals will make bronchitis worse. If you smoke and you need help quitting, ask your health  care provider. Quitting smoking will help your lungs heal faster.  Use an inhaler, cool mist vaporizer, or humidifier as told by your health care provider.  Keep all follow-up visits as told by your health care provider. This is important. How is this prevented? To lower your risk of getting this condition again:  Wash your hands often with soap and water. If soap and water are not available, use hand sanitizer.  Avoid contact with people who have cold symptoms.  Try not to touch your hands  to your mouth, nose, or eyes.  Make sure to get the flu shot every year.  Contact a health care provider if:  Your symptoms do not improve in 2 weeks of treatment. Get help right away if:  You cough up blood.  You have chest pain.  You have severe shortness of breath.  You become dehydrated.  You faint or keep feeling like you are going to faint.  You keep vomiting.  You have a severe headache.  Your fever or chills gets worse. This information is not intended to replace advice given to you by your health care provider. Make sure you discuss any questions you have with your health care provider. Document Released: 08/31/2004 Document Revised: 02/16/2016 Document Reviewed: 01/12/2016 Elsevier Interactive Patient Education  [employer].

## 2017-11-27 NOTE — Progress Notes (Signed)
Subjective:    Patient ID: [name], female    DOB: [birth year], 52 y.o.   MRN: [id number]  52y/o caucasian established female pt c/o maxillary and frontal sinus pain and pressure, pressure behind eyes, HA, upper teeth pain, nasal congestion, sore throat, bilateral otalgia, chest congestion with productive cough x1 week. Fever 4 days ago. Afebrile today, but c/o chills. Was taking Mucinex at home over the weekend with some relief. Given multi-symptom relief (Phenylephrine, Guaifenesin, Acetaminophen, Dextromethorphan) yesterday in RN clinic. Mild short term relief at home with that. Also using Flonase at home.   Patient reported sick contacts at work; diabetic fasting blood sugars 80-106 per patient.  Has not used inhaler or tessalon pearles in the past.  Last antibiotics augmentin Mar 2018.  Started nasal saline yesterday.  Not using OTC antihistamine.     Review of Systems  Constitutional: Positive for chills, fatigue and fever. Negative for activity change, appetite change, diaphoresis and unexpected weight change.  HENT: Positive for congestion, ear pain, postnasal drip, rhinorrhea, sinus pressure, sinus pain and sore throat. Negative for dental problem, drooling, ear discharge, facial swelling, hearing loss, mouth sores, nosebleeds, sneezing, tinnitus, trouble swallowing and voice change.   Eyes: Negative for photophobia, pain, discharge, redness, itching and visual disturbance.  Respiratory: Positive for cough. Negative for choking, chest tightness, shortness of breath, wheezing and stridor.   Cardiovascular: Negative for chest pain, palpitations and leg swelling.  Gastrointestinal: Negative for abdominal distention, abdominal pain, blood in stool, diarrhea, nausea and vomiting.  Endocrine: Negative for cold intolerance and heat intolerance.  Genitourinary: Negative for difficulty urinating, dysuria and hematuria.  Musculoskeletal: Negative for arthralgias, back pain, gait problem, joint  swelling, myalgias, neck pain and neck stiffness.  Skin: Negative for color change, pallor, rash and wound.  Allergic/Immunologic: Positive for environmental allergies. Negative for food allergies.  Neurological: Positive for headaches. Negative for dizziness, tremors, seizures, syncope, facial asymmetry, speech difficulty, weakness, light-headedness and numbness.  Hematological: Negative for adenopathy. Does not bruise/bleed easily.  Psychiatric/Behavioral: Negative for agitation, behavioral problems, confusion and sleep disturbance.       Objective:   Physical Exam  Constitutional: She is oriented to person, place, and time. Vital signs are normal. She appears well-developed and well-nourished. She is active and cooperative.  Non-toxic appearance. She does not have a sickly appearance. She appears ill. No distress.  HENT:  Head: Normocephalic and atraumatic.  Right Ear: Hearing, external ear and ear canal normal. Tympanic membrane is erythematous and bulging. A middle ear effusion is present.  Left Ear: Hearing, external ear and ear canal normal. A middle ear effusion is present.  Nose: Mucosal edema and rhinorrhea present. No nose lacerations, sinus tenderness, nasal deformity, septal deviation or nasal septal hematoma. No epistaxis.  No foreign bodies. Right sinus exhibits no maxillary sinus tenderness and no frontal sinus tenderness. Left sinus exhibits no maxillary sinus tenderness and no frontal sinus tenderness.  Mouth/Throat: Uvula is midline and mucous membranes are normal. Mucous membranes are not pale, not dry and not cyanotic. She does not have dentures. No oral lesions. No trismus in the jaw. Normal dentition. No dental abscesses, uvula swelling, lacerations or dental caries. Posterior oropharyngeal edema and posterior oropharyngeal erythema present. No oropharyngeal exudate or tonsillar abscesses.  Cobblestoning posterior pharynx; right TM air fluid level slightly opacaity 25% and  central erythema bulging; left TM air fluid level clear; bilateral allergic shiners; bilateral nasal turbinates clear discharge  Eyes: Pupils are equal, round, and reactive to light. Conjunctivae,  EOM and lids are normal. Right eye exhibits no chemosis, no discharge, no exudate and no hordeolum. No foreign body present in the right eye. Left eye exhibits no chemosis, no discharge, no exudate and no hordeolum. No foreign body present in the left eye. Right conjunctiva is not injected. Right conjunctiva has no hemorrhage. Left conjunctiva is not injected. Left conjunctiva has no hemorrhage. No scleral icterus. Right eye exhibits normal extraocular motion and no nystagmus. Left eye exhibits normal extraocular motion and no nystagmus. Right pupil is round and reactive. Left pupil is round and reactive. Pupils are equal.  Neck: Trachea normal, normal range of motion and phonation normal. Neck supple. No tracheal tenderness and no muscular tenderness present. No neck rigidity. No tracheal deviation, no edema, no erythema and normal range of motion present. No thyroid mass and no thyromegaly present.  Cardiovascular: Normal rate, regular rhythm, S1 normal, S2 normal, normal heart sounds and intact distal pulses. PMI is not displaced. Exam reveals no gallop, no distant heart sounds and no friction rub.  No murmur heard. Pulmonary/Chest: Effort normal and breath sounds normal. No accessory muscle usage or stridor. No respiratory distress. She has no decreased breath sounds. She has no wheezes. She has no rhonchi. She has no rales. She exhibits no tenderness.  Frequent nonproductive cough in exam room; spoke full sentences without difficulty  Abdominal: Soft. Normal appearance. She exhibits no distension, no fluid wave and no ascites. There is no rigidity and no guarding.  Musculoskeletal: Normal range of motion. She exhibits no edema or tenderness.       Right shoulder: Normal.       Left shoulder: Normal.        Right elbow: Normal.      Left elbow: Normal.       Right hip: Normal.       Left hip: Normal.       Right knee: Normal.       Left knee: Normal.       Cervical back: Normal.       Thoracic back: Normal.       Lumbar back: Normal.       Right hand: Normal.       Left hand: Normal.  Lymphadenopathy:       Head (right side): No submental, no submandibular, no tonsillar, no preauricular, no posterior auricular and no occipital adenopathy present.       Head (left side): No submental, no submandibular, no tonsillar, no preauricular, no posterior auricular and no occipital adenopathy present.    She has no cervical adenopathy.       Right cervical: No superficial cervical, no deep cervical and no posterior cervical adenopathy present.      Left cervical: No superficial cervical, no deep cervical and no posterior cervical adenopathy present.  Neurological: She is alert and oriented to person, place, and time. She has normal strength. She is not disoriented. She displays no atrophy and no tremor. No cranial nerve deficit or sensory deficit. She exhibits normal muscle tone. She displays no seizure activity. Coordination and gait normal. GCS eye subscore is 4. GCS verbal subscore is 5. GCS motor subscore is 6.  On/off exam table; in/out of chair without difficulty; gait sure and steady in exam room  Skin: Skin is warm, dry and intact. No abrasion, no bruising, no burn, no ecchymosis, no laceration, no lesion, no petechiae and no rash noted. She is not diaphoretic. No cyanosis or erythema. No pallor. Nails show no  clubbing.  Psychiatric: She has a normal mood and affect. Her speech is normal and behavior is normal. Judgment and thought content normal. Cognition and memory are normal.  Nursing note and vitals reviewed.         Assessment & Plan:  A-acute right otitis media, acute bronchitis, recurrent ethmoid sinusitis, nonallergic rhinitis  P-continue flonase 1 spray each nostril BID#1 RF6,  saline 2 sprays each nostril q2h wa prn congestion given 1 bottle from clinic stock. augmentin 875mg  po BID x 10 days #20 RF0 dispensed from [facility].  Denied personal or family history of ENT cancer. Tylenol 1000mg  po QID prn pain given 8 UD from clinic stock.  Shower BID especially prior to bed. No evidence of systemic bacterial infection, non toxic and well hydrated.  I do not see where any further testing or imaging is necessary at this time.   I will suggest supportive care, rest, good hygiene and encourage the patient to take adequate fluids.  The patient is to return to clinic or EMERGENCY ROOM if symptoms worsen or change significantly.  Exitcare handout on sinusitis and sinus rinse given to patient.  Patient verbalized agreement and understanding of treatment plan and had no further questions at this time.   P2:  Hand washing and cover cough   Cough lozenges po q2h prn cough given 8 UD from clinic stock.  Consider Prednisone taper 10mg  (60/50/40/30/20/10mg ) po daily with breakfast #21 RF0 if no improvement with plan of care x 48 hours.  Patient to follow up in 48 hours for re-evaluation.  Holding as patient diabetic and possible side effects increased blood sugar, increased blood pressure and heart rate.  Albuterol MDI 67mcg 1-2 puffs po q4-6h prn protracted cough/wheeze #1 RF0 Electronic Rx to her pharmacy of choice discussed side effect increased heart rate. Bronchitis simple, community acquired, may have started as viral (probably respiratory syncytial, parainfluenza, influenza, or adenovirus), but now evidence of acute purulent bronchitis with resultant bronchial edema and mucus formation.  Viruses are the most common cause of bronchial inflammation in otherwise healthy adults with acute bronchitis.  The appearance of sputum is not predictive of whether a bacterial infection is present.  Purulent sputum is most often caused by viral infections.  There are a small portion of those caused by non-viral  agents being Mycoplama pneumonia.  Microscopic examination or C&S of sputum in the healthy adult with acute bronchitis is generally not helpful (usually negative or normal respiratory flora) other considerations being cough from upper respiratory tract infections, sinusitis or allergic syndromes (mild asthma or viral pneumonia).  Differential Diagnoses:  reactive airway disease (asthma, allergic aspergillosis (eosinophilia), chronic bronchitis, respiratory infection (sinusitis, common cold, pneumonia), congestive heart failure, reflux esophagitis, bronchogenic tumor, aspiration syndromes and/or exposure to pulmonary irritants/smoke.  I will order Doxycycline 100mg  two times a day for ten days for possible Mycoplamsa.  Without high fever, severe dyspnea, lack of physical findings or other risk factors, I will hold on a chest radiograph and CBC at this time.  I discussed that approximately 50% of patients with acute bronchitis have a cough that lasts up to three weeks, and 25% for over a month.  Tylenol 325mg  one to three tablets every six hours as needed for fever or myalgias.  No aspirin. Exitcare handout on bronchitis and inhaler use given to patient.  Discussed may also use nyquil or robitussin or delsym OTC cough suppressant.  ER if hemopthysis, SOB, worst chest pain of life.   Patient instructed to follow  up in one week or sooner if symptoms worsen.  Patient verbalized agreement and understanding of treatment plan.  P2:  hand washing and cover cough  Patient may use normal saline nasal spray 2 sprays each nostril q2h wa as needed. flonase 68mcg 1 spray each nostril BID #1 RF0.  Patient denied personal or family history of ENT cancer.  OTC antihistamine of choice claritin/zyrtec 10mg  po daily.  Avoid triggers if possible.  Shower prior to bedtime if exposed to triggers.  If allergic dust/dust mites recommend mattress/pillow covers/encasements; washing linens, vacuuming, sweeping, dusting weekly.  Call or return  to clinic as needed if these symptoms worsen or fail to improve as anticipated.   Exitcare handout on nonallergic and allergic rhinitis and sinus rinse given to patient.  Patient verbalized understanding of instructions, agreed with plan of care and had no further questions at this time.   Supportive treatment. Augmentin 875mg  po BID x 10 days #20 RF0 dispensed from [facility]  No evidence of invasive bacterial infection, non toxic and well hydrated.  This is most likely self limiting viral infection.  I do not see where any further testing or imaging is necessary at this time.   I will suggest supportive care, rest, good hygiene and encourage the patient to take adequate fluids.  The patient is to return to clinic or EMERGENCY ROOM if symptoms worsen or change significantly e.g. ear pain, fever, purulent discharge from ears or bleeding.  Exitcare handout on otitis media given to patient.  Patient verbalized agreement and understanding of treatment plan.   P2:  Avoidance and hand washing.

## 2017-12-18 ENCOUNTER — Ambulatory Visit: Payer: Self-pay | Admitting: Registered Nurse

## 2017-12-18 VITALS — BP 135/84 | HR 75 | Temp 97.6°F

## 2017-12-18 DIAGNOSIS — N3001 Acute cystitis with hematuria: Secondary | ICD-10-CM

## 2017-12-18 LAB — POCT URINALYSIS DIPSTICK
Bilirubin, UA: NEGATIVE
GLUCOSE UA: NEGATIVE
KETONES UA: NEGATIVE
NITRITE UA: NEGATIVE
Protein Ur, POC: NEGATIVE mg/dL
SPEC GRAV UA: 1.01 (ref 1.010–1.025)
Urobilinogen, UA: 0.2 E.U./dL
pH, UA: 6 (ref 5.0–8.0)

## 2017-12-18 MED ORDER — SULFAMETHOXAZOLE-TRIMETHOPRIM 800-160 MG PO TABS: 1.0000 | ORAL_TABLET | Freq: Two times a day (BID) | ORAL | 0 refills | Status: DC

## 2017-12-18 MED ORDER — PHENAZOPYRIDINE HCL 200 MG PO TABS: 200.0000 mg | ORAL_TABLET | Freq: Three times a day (TID) | ORAL | 0 refills | Status: DC

## 2017-12-18 NOTE — Progress Notes (Signed)
Subjective:    Patient ID: [name], female    DOB: 1966/03/07, 52 y.o.   MRN: [id number]  52y/o caucasian [place] established female pt c/o urinary frequency, urgency, burning with urination and stabbing pain at end of urination x4 days. Reports suprapubic pressure intermittently. Noted small amount of blood on tissue with wiping yesterday but no frank blood in urine.  Type II diabetic last nonfasting blood sugar 145 Sunday (48 hours ago after large mothers day lunch).  Denied nausea/vomiting/diarrhea/back pain/hand/feet swelling.      Review of Systems  Constitutional: Negative for activity change, appetite change, chills, diaphoresis, fatigue and fever.  HENT: Negative for congestion, ear pain, sore throat, trouble swallowing and voice change.   Eyes: Negative for pain and discharge.  Respiratory: Negative for cough and wheezing.   Cardiovascular: Negative for chest pain and leg swelling.  Gastrointestinal: Negative for abdominal distention, blood in stool, constipation, diarrhea, nausea and vomiting.  Endocrine: Negative for cold intolerance and heat intolerance.  Genitourinary: Positive for dysuria and urgency. Negative for difficulty urinating, hematuria, vaginal bleeding and vaginal discharge.  Musculoskeletal: Negative for arthralgias, back pain, gait problem, joint swelling, myalgias, neck pain and neck stiffness.  Skin: Negative for rash.  Allergic/Immunologic: Positive for environmental allergies and immunocompromised state. Negative for food allergies.  Neurological: Negative for dizziness, tremors, syncope, weakness and headaches.  Hematological: Negative for adenopathy. Does not bruise/bleed easily.  Psychiatric/Behavioral: Negative for agitation, confusion and sleep disturbance. The patient is not nervous/anxious.        Objective:   Physical Exam  Constitutional: She is oriented to person, place, and time. Vital signs are normal. She appears well-developed and  well-nourished. She is active and cooperative.  Non-toxic appearance. She does not have a sickly appearance. She does not appear ill. No distress.  HENT:  Head: Normocephalic and atraumatic.  Right Ear: Hearing and external ear normal.  Left Ear: Hearing and external ear normal.  Nose: Nose normal.  Mouth/Throat: Uvula is midline, oropharynx is clear and moist and mucous membranes are normal. No oropharyngeal exudate.  Bilateral allergic shiners  Eyes: Pupils are equal, round, and reactive to light. Conjunctivae, EOM and lids are normal. Right eye exhibits no discharge. Left eye exhibits no discharge. No scleral icterus.  Neck: Trachea normal, normal range of motion and phonation normal. Neck supple. No tracheal tenderness present. No tracheal deviation present. No thyromegaly present.  Cardiovascular: Normal rate, regular rhythm, S1 normal, S2 normal, normal heart sounds and intact distal pulses. Exam reveals no gallop, no distant heart sounds and no friction rub.  No murmur heard. Pulmonary/Chest: Effort normal and breath sounds normal. No stridor. No respiratory distress. She has no decreased breath sounds. She has no wheezes. She has no rhonchi. She has no rales. She exhibits no tenderness.  No cough observed in exam room; spoke full sentences without difficulty  Abdominal: Soft. Bowel sounds are normal. She exhibits no shifting dullness, no distension, no pulsatile liver, no fluid wave, no abdominal bruit, no ascites, no pulsatile midline mass and no mass. There is tenderness in the suprapubic area. There is no rigidity, no rebound, no guarding, no CVA tenderness, no tenderness at McBurney's point and negative Murphy's sign. No hernia. Hernia confirmed negative in the ventral area.    Dull to percussion x 4 quads; normoactive bowel sounds x 4 quads; pressure and urge to urinate with suprapubic percussion/palpation  Musculoskeletal: Normal range of motion. She exhibits no edema, tenderness or  deformity.  Right shoulder: Normal.       Left shoulder: Normal.       Right elbow: Normal.      Left elbow: Normal.       Right hip: Normal.       Left hip: Normal.       Right knee: Normal.       Left knee: Normal.       Right ankle: Normal.       Left ankle: Normal.       Cervical back: Normal.       Thoracic back: Normal.       Lumbar back: Normal.       Right hand: Normal.       Left hand: Normal.  Lymphadenopathy:       Head (right side): No submental, no submandibular, no tonsillar, no preauricular, no posterior auricular and no occipital adenopathy present.       Head (left side): No submental, no submandibular, no tonsillar, no preauricular, no posterior auricular and no occipital adenopathy present.    She has no cervical adenopathy.       Right cervical: No superficial cervical, no deep cervical and no posterior cervical adenopathy present.      Left cervical: No superficial cervical, no deep cervical and no posterior cervical adenopathy present.  Neurological: She is alert and oriented to person, place, and time. She has normal strength. She is not disoriented. She displays no atrophy and no tremor. No cranial nerve deficit or sensory deficit. She exhibits normal muscle tone. She displays no seizure activity. Coordination and gait normal. GCS eye subscore is 4. GCS verbal subscore is 5. GCS motor subscore is 6.  On/off exam table; in/out of chair without difficulty sitting to supine and back to sitting on exam table quickly; gait sure and steady in hallway  Skin: Skin is warm, dry and intact. Capillary refill takes less than 2 seconds. No rash noted. She is not diaphoretic. No cyanosis or erythema. No pallor. Nails show no clubbing.  Psychiatric: She has a normal mood and affect. Her speech is normal and behavior is normal. Judgment and thought content normal. Cognition and memory are normal.  Nursing note and vitals reviewed.    Suprapubic pressure negative cva  tenderness bilaterally normoactive bowel sounds     Assessment & Plan:  A-UTI acute with hematuria  P-bactrim DS po BID x 7 days #40 RF0 dispensed from [facility]  Pyridium 200mg  po TID x2 days #6 RF0 electronic Rx  Repeat dipstick urinalysis in 1 week with RN Hildred Alamin   Medications as directed. Patient is also to push fluids and may use Pyridium 200mg  po TID as needed. Hydrate, avoid dehydration. Avoid holding urine void on frequent basis every 4 to 6 hours. If unable to void every 8 hours follow up for re-evaluation with PCM, urgent care or ER. Call or return to clinic as needed if these symptoms worsen or fail to improve as anticipated. Exitcare handout on cystitis given to patient Patient verbalized agreement and understanding of treatment plan and had no further questions at this time.  P2: Hydrate and cranberry juice

## 2017-12-18 NOTE — Patient Instructions (Signed)

## 2017-12-20 ENCOUNTER — Telehealth: Payer: Self-pay | Admitting: Registered Nurse

## 2017-12-20 ENCOUNTER — Encounter: Payer: Self-pay | Admitting: Registered Nurse

## 2017-12-20 NOTE — Telephone Encounter (Signed)
Noted  

## 2017-12-20 NOTE — Telephone Encounter (Signed)
Patient contacted clinic to let us know she is feeling much better on bactrim DS and pyridium for acute URI.  Last home fasting blood sugar yesterday 120.  Denied burning/frequency/urgency/back pain/belly pain/fever/headache/nausea/vomiting/chills.  Patient to take bactrim DS po BID x 5 days.  DIscussed with patient shortest course 3 days and due to her type II diabetes recommend at least 5 days if symptoms resolved with 2 days use.  Patient to hydrate to keep urine pale clear yellow and follow up if cloudy/smelly urine, back/stomach pain, fever or chills for re-evaluation.  Repeat urinalysis to ensure returned to normal next week.  Patient verbalized understanding information/instructions, agreed with plan of care and had no further questions at this time.

## 2017-12-20 NOTE — Telephone Encounter (Signed)
Noted. Plan to repeat POC UA dip 12/25/17.

## 2017-12-25 ENCOUNTER — Ambulatory Visit: Payer: Self-pay | Admitting: Registered Nurse

## 2017-12-25 VITALS — BP 121/89 | HR 78 | Temp 97.4°F

## 2017-12-25 DIAGNOSIS — N3001 Acute cystitis with hematuria: Secondary | ICD-10-CM

## 2017-12-25 DIAGNOSIS — Z09 Encounter for follow-up examination after completed treatment for conditions other than malignant neoplasm: Secondary | ICD-10-CM

## 2017-12-25 DIAGNOSIS — G4762 Sleep related leg cramps: Secondary | ICD-10-CM

## 2017-12-25 LAB — POCT URINALYSIS DIPSTICK
Bilirubin, UA: NEGATIVE
GLUCOSE UA: NEGATIVE
Ketones, POC UA: NEGATIVE mg/dL
Leukocytes, UA: NEGATIVE
Nitrite, UA: NEGATIVE
PH UA: 6.5 (ref 5.0–8.0)
PROTEIN UA: NEGATIVE
SPEC GRAV UA: 1.015 (ref 1.010–1.025)
UROBILINOGEN UA: 0.2 U/dL

## 2017-12-25 NOTE — Patient Instructions (Signed)
Leg Cramps Leg cramps occur when a muscle or muscles tighten and you have no control over this tightening (involuntary muscle contraction). Muscle cramps can develop in any muscle, but the most common place is in the calf muscles of the leg. Those cramps can occur during exercise or when you are at rest. Leg cramps are painful, and they may last for a few seconds to a few minutes. Cramps may return several times before they finally stop. Usually, leg cramps are not caused by a serious medical problem. In many cases, the cause is not known. Some common causes include:  Overexertion.  Overuse from repetitive motions, or doing the same thing over and over.  Remaining in a certain position for a long period of time.  Improper preparation, form, or technique while performing a sport or an activity.  Dehydration.  Injury.  Side effects of some medicines.  Abnormally low levels of the salts and ions in your blood (electrolytes), especially potassium and calcium. These levels could be low if you are taking water pills (diuretics) or if you are pregnant.  Follow these instructions at home: Watch your condition for any changes. Taking the following actions may help to lessen any discomfort that you are feeling:  Stay well-hydrated. Drink enough fluid to keep your urine clear or pale yellow.  Try massaging, stretching, and relaxing the affected muscle. Do this for several minutes at a time.  For tight or tense muscles, use a warm towel, heating pad, or hot shower water directed to the affected area.  If you are sore or have pain after a cramp, applying ice to the affected area may relieve discomfort. ? Put ice in a plastic bag. ? Place a towel between your skin and the bag. ? Leave the ice on for 20 minutes, 2-3 times per day.  Avoid strenuous exercise for several days if you have been having frequent leg cramps.  Make sure that your diet includes the essential minerals for your muscles to  work normally.  Take medicines only as directed by your health care provider.  Contact a health care provider if:  Your leg cramps get more severe or more frequent, or they do not improve over time.  Your foot becomes cold, numb, or blue. This information is not intended to replace advice given to you by your health care provider. Make sure you discuss any questions you have with your health care provider. Document Released: 08/31/2004 Document Revised: 12/30/2015 Document Reviewed: 07/01/2014 Elsevier Interactive Patient Education  2018 [employer]  

## 2017-12-25 NOTE — Progress Notes (Signed)
Subjective:    Patient ID: [name], female    DOB: [birth year], 52 y.o.   MRN: [id number]  52 y/o [place] female pt well known to the clinic c/o nocturnal leg cramps x2 nights. Reports they occurred intermittently for hours on Sunday night in R hamstring, R calf and R foot. Occasionally she would have mild cramp in L quad. RN suggested pt drink 2 bottles No sugar added Gatorade and water until appt with NP today. Pt has had at least 36oz Gatorade in past 24 hrs. Cramps still occurred last night but were not as intense, did not last as long. Stated no cramping during the daytime.  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  Typically does not stretch.  Last labs fall 2018 be well. Pt also needing to provide urine sample for repeat ua dip s/p abx treatment for uti started 12/18/17. Pt provided sample and reports she is on the last day of her menstrual cycle. Stated no sx after only a couple of days starting the abx. One twinge of burning last night during urination that quickly resolved. Denies any suprapubic pain, pressure, frequency or urgency.   Blood sugar has been her usual 120/130 fasting doesn't check daily.    Patient would also like me to check right knee lump--present for many years not painful not enlarging no color or size changes.     Review of Systems  Constitutional: Negative for activity change, appetite change, chills, diaphoresis, fatigue, fever and unexpected weight change.  HENT: Negative for trouble swallowing and voice change.   Eyes: Negative for photophobia and visual disturbance.  Respiratory: Negative for cough, shortness of breath, wheezing and stridor.   Cardiovascular: Negative for chest pain.  Gastrointestinal: Negative for diarrhea, nausea and vomiting.  Endocrine: Negative for cold intolerance and heat intolerance.   Genitourinary: Positive for dysuria. Negative for difficulty urinating, enuresis, flank pain, genital sores and menstrual problem.  Musculoskeletal: Positive for myalgias. Negative for arthralgias, back pain, gait problem, joint swelling, neck pain and neck stiffness.  Skin: Negative for color change, pallor, rash and wound.  Allergic/Immunologic: Positive for immunocompromised state. Negative for food allergies.  Neurological: Negative for dizziness, tremors, seizures, syncope, facial asymmetry, speech difficulty, weakness, light-headedness, numbness and headaches.  Hematological: Negative for adenopathy. Does not bruise/bleed easily.  Psychiatric/Behavioral: Positive for sleep disturbance. Negative for agitation and confusion.       Objective:   Physical Exam  Constitutional: She is oriented to person, place, and time. Vital signs are normal. She appears well-developed and well-nourished. She is active and cooperative.  Non-toxic appearance. She does not have a sickly appearance. She does not appear ill. No distress.  HENT:  Head: Normocephalic and atraumatic.  Right Ear: Hearing and external ear normal.  Left Ear: Hearing and external ear normal.  Nose: Nose normal.  Mouth/Throat: Uvula is midline, oropharynx is clear and moist and mucous membranes are normal. No oropharyngeal exudate.  Eyes: Pupils are equal, round, and reactive to light. Conjunctivae, EOM and lids are normal. Right eye exhibits no discharge. Left eye exhibits no discharge. No scleral icterus.  Neck: Trachea normal, normal range of motion and phonation normal. Neck supple. No tracheal deviation present.  Cardiovascular: Normal rate, regular rhythm, S1 normal, S2 normal, normal heart sounds and intact distal pulses. Exam reveals no gallop, no distant heart sounds and no friction rub.  No murmur heard. Pulses:      Radial pulses are 2+ on the right side, and 2+ on the left side.       Popliteal pulses are 2+ on the right  side, and 2+ on the left side.       Dorsalis pedis pulses are 2+ on the right side, and 2+ on the left side.       Posterior tibial pulses are 2+ on the right side, and 2+ on the left side.  Pulmonary/Chest: Effort normal and breath sounds normal. No stridor. No respiratory distress. She has no decreased breath sounds. She has no wheezes. She has no rhonchi. She has no rales. She exhibits no tenderness.  No cough observed in exam room; spoke full sentences without difficulty  Abdominal: Soft. Normal appearance. She exhibits no distension, no fluid wave and no ascites. There is no tenderness. There is no rigidity and no guarding.  Musculoskeletal: Normal range of motion. She exhibits tenderness. She exhibits no edema.       Right shoulder: Normal.       Left shoulder: Normal.       Right elbow: Normal.      Left elbow: Normal.       Right hip: Normal.       Left hip: Normal.       Right knee: She exhibits deformity. She exhibits normal range of motion, no swelling, no effusion, no ecchymosis, no laceration, no erythema, normal alignment, no LCL laxity, normal patellar mobility, no bony tenderness, normal meniscus and no MCL laxity. No tenderness found.       Left knee: Normal.       Right ankle: Normal.       Left ankle: Normal.       Cervical back: Normal.       Thoracic back: Normal.       Lumbar back: Normal.       Right hand: Normal.       Left hand: Normal.       Right upper leg: Normal.       Left upper leg: Normal.       Right lower leg: Normal.       Left lower leg: Normal.       Legs:      Right foot: Normal. There is normal range of motion and no deformity.       Left foot: Normal. There is normal range of motion and no deformity.  Right gastrocnemius all and achilles tendon proximal to distal TTP mild; no crepitus or defect palpated; able to touch toes with slow extended stretch otherwise shins easily touched; no erythema/rash/ecchymosis/swelling noted; negative bilateral  homan's sign  Feet:  Right Foot:  Protective Sensation: 10 sites tested. 7 sites sensed.  Skin Integrity: Positive for callus. Negative for ulcer, blister, skin breakdown, erythema, warmth or dry skin.  Left Foot:  Protective Sensation: 10 sites tested. 7 sites sensed.  Skin Integrity: Positive for callus. Negative for ulcer, blister, skin breakdown, erythema, warmth or dry skin.  Lymphadenopathy:       Head (right side): No submental, no submandibular, no tonsillar, no preauricular, no posterior auricular and no occipital adenopathy present.       Head (left side): No submental, no submandibular, no tonsillar, no preauricular, no posterior auricular and no occipital adenopathy present.    She has no cervical adenopathy.       Right cervical: No superficial cervical, no deep cervical and no posterior  cervical adenopathy present.      Left cervical: No superficial cervical, no deep cervical and no posterior cervical adenopathy present.  Neurological: She is alert and oriented to person, place, and time. She has normal strength. She is not disoriented. She displays no atrophy and no tremor. No cranial nerve deficit or sensory deficit. She exhibits normal muscle tone. She displays no seizure activity. Coordination and gait normal. GCS eye subscore is 4. GCS verbal subscore is 5. GCS motor subscore is 6.  Reflex Scores:      Patellar reflexes are 0 on the right side and 0 on the left side.      Achilles reflexes are 0 on the right side and 0 on the left side. Normal heel/toe gait in hallway; on/off exam table and in/out of chair without difficulty; distraction and without distraction DTRs achilles/patellar with no response; hand grasp and shoulder shrug equal 5/5 upper and lower extremity strength equal 5/5  Skin: Skin is warm, dry and intact. Capillary refill takes less than 2 seconds. No abrasion, no bruising, no burn, no ecchymosis, no laceration, no petechiae and no rash noted. She is not  diaphoretic. No cyanosis or erythema. No pallor. Nails show no clubbing.  Psychiatric: She has a normal mood and affect. Her speech is normal and behavior is normal. Judgment and thought content normal. She is not actively hallucinating. Cognition and memory are normal. She is attentive.  Nursing note and vitals reviewed.    Trace blood urinalysis ohterwise normal hydrate to keep urine pale clear yellow tinged.  Patient reported on last days of her menstrual cycle.  Patient notified of results, verbalized understanding and had no further questions at this time.     Assessment & Plan:  A-Nocturnal leg cramps, type II diabetes with neuropathy  P-Fasting labs female executive panel with HgbA1c Friday at 0830 with RN Hildred Alamin scheduled per patient preference.  Continue hydrating with water and no sugar added electrolyte drink daily until labs completed.  Demonstrated and Discussed stretching calf/hamstring/hip printed and given exitcare handout on leg cramps.  Discussed low iron/electrolytes can cause cramping along with muscle fatigue.  Last labs 6 months ago will recheck due to type II diabetes.  Ensure stretching prior to bedtime and stop exercise 2 hours prior to bedtime.  Hydrate to keep urine pale clear yellow tinged.  Monofilament testing today with 7/10 correct bilaterally feet-neuropathy present no skin lesion some callous noted.  Follow up if weakness, worsening cramps, repetitive vomiting/diarrhea, blood sugar testing higher or lower than normal at home.  Patient verbalized understanding of information/instructions, agreed with plan of care and had no further questions at this time.

## 2017-12-28 ENCOUNTER — Other Ambulatory Visit: Payer: Self-pay | Admitting: *Deleted

## 2017-12-28 DIAGNOSIS — E119 Type 2 diabetes mellitus without complications: Secondary | ICD-10-CM

## 2017-12-28 DIAGNOSIS — G4762 Sleep related leg cramps: Secondary | ICD-10-CM

## 2017-12-28 NOTE — Progress Notes (Signed)
Fasting labs drawn per NP orders from [facility] 12/25/17. Pt reports decreased sleep last night 2/2 continued nocturnal leg cramps, now present in bil quads and bil calves.

## 2017-12-29 LAB — CMP12+LP+TP+TSH+6AC+CBC/D/PLT
ALBUMIN: 4.5 g/dL (ref 3.5–5.5)
ALT: 24 IU/L (ref 0–32)
AST: 20 IU/L (ref 0–40)
Albumin/Globulin Ratio: 2.3 — ABNORMAL HIGH (ref 1.2–2.2)
Alkaline Phosphatase: 42 IU/L (ref 39–117)
BASOS ABS: 0 10*3/uL (ref 0.0–0.2)
BASOS: 1 %
BILIRUBIN TOTAL: 0.8 mg/dL (ref 0.0–1.2)
BUN/Creatinine Ratio: 19 (ref 9–23)
BUN: 12 mg/dL (ref 6–24)
CALCIUM: 9.2 mg/dL (ref 8.7–10.2)
CHLORIDE: 100 mmol/L (ref 96–106)
CREATININE: 0.63 mg/dL (ref 0.57–1.00)
Chol/HDL Ratio: 2.8 ratio (ref 0.0–4.4)
Cholesterol, Total: 164 mg/dL (ref 100–199)
EOS (ABSOLUTE): 0.1 10*3/uL (ref 0.0–0.4)
EOS: 2 %
Free Thyroxine Index: 1.6 (ref 1.2–4.9)
GFR, EST AFRICAN AMERICAN: 118 mL/min/{1.73_m2} (ref >59)
GFR, EST NON AFRICAN AMERICAN: 102 mL/min/{1.73_m2} (ref >59)
GGT: 20 IU/L (ref 0–60)
GLUCOSE: 112 mg/dL — AB (ref 65–99)
Globulin, Total: 2 g/dL (ref 1.5–4.5)
HDL: 58 mg/dL (ref >39)
HEMATOCRIT: 37.6 % (ref 34.0–46.6)
HEMOGLOBIN: 12.2 g/dL (ref 11.1–15.9)
IRON: 80 ug/dL (ref 27–159)
Immature Grans (Abs): 0 10*3/uL (ref 0.0–0.1)
Immature Granulocytes: 0 %
LDH: 159 IU/L (ref 119–226)
LDL CALC: 86 mg/dL (ref 0–99)
LYMPHS ABS: 1.9 10*3/uL (ref 0.7–3.1)
Lymphs: 27 %
MCH: 28.8 pg (ref 26.6–33.0)
MCHC: 32.4 g/dL (ref 31.5–35.7)
MCV: 89 fL (ref 79–97)
MONOCYTES: 6 %
Monocytes Absolute: 0.4 10*3/uL (ref 0.1–0.9)
NEUTROS ABS: 4.6 10*3/uL (ref 1.4–7.0)
Neutrophils: 64 %
PHOSPHORUS: 3.8 mg/dL (ref 2.5–4.5)
PLATELETS: 322 10*3/uL (ref 150–450)
Potassium: 4.3 mmol/L (ref 3.5–5.2)
RBC: 4.24 x10E6/uL (ref 3.77–5.28)
RDW: 14.1 % (ref 12.3–15.4)
SODIUM: 139 mmol/L (ref 134–144)
T3 Uptake Ratio: 27 % (ref 24–39)
T4, Total: 6 ug/dL (ref 4.5–12.0)
TOTAL PROTEIN: 6.5 g/dL (ref 6.0–8.5)
TSH: 1.29 u[IU]/mL (ref 0.450–4.500)
Triglycerides: 101 mg/dL (ref 0–149)
URIC ACID: 4.9 mg/dL (ref 2.5–7.1)
VLDL CHOLESTEROL CAL: 20 mg/dL (ref 5–40)
WBC: 7.1 10*3/uL (ref 3.4–10.8)

## 2017-12-29 LAB — HGB A1C W/O EAG: HEMOGLOBIN A1C: 6.2 % — AB (ref 4.8–5.6)

## 2018-01-03 NOTE — Progress Notes (Signed)
Results reviewed with pt. Glucose slightly elevated from previous but A1c stable. Triglycerides improved, now WNL. Previous anemia 1 year ago still stable from 10 months ago. Pt reports taking iron supplement every few days. Cannot tolerate taking it daily 2/2 constipation, bloating.  Diet and exercise recommendations discussed re: elevated A1c. Reviewed MyChart note to pt as she had not seen it yet. She reports she tried a "cold spray" that her son purchased for her leg cramps last night that provided minimal relief. Nothing else she has tried has worked, including the stretches reviewed by NP, so she feels she will likely try B vitamin and/or magnesium supplements next. Advised pt to stay within the recommended daily allowance of supplements.

## 2018-01-03 NOTE — Progress Notes (Signed)
Noted please schedule patient for follow up next week to re-evaluated and have her bring her glucose monitor to appt

## 2018-04-19 LAB — HM MAMMOGRAPHY

## 2018-05-09 ENCOUNTER — Encounter: Payer: Self-pay | Admitting: Registered Nurse

## 2018-05-09 ENCOUNTER — Ambulatory Visit: Payer: Self-pay | Admitting: Registered Nurse

## 2018-05-09 VITALS — BP 132/79 | HR 73 | Temp 97.6°F

## 2018-05-09 DIAGNOSIS — M7989 Other specified soft tissue disorders: Secondary | ICD-10-CM

## 2018-05-09 NOTE — Progress Notes (Signed)
Subjective:    Patient ID: [name], female    DOB: 1966/02/03, 52 y.o.   MRN: [id number]  52y/o Caucasian established female pt reports she was lying down with her arm propped up approx 2-3 weeks ago and at that angle, noticed some swelling to her L posterior wrist. It is non-pitting. Denies any pain or limited ROM 2/2 swelling. Denies any known injury, insect bite, previous occurrence of swelling. No warmth, erythema, bruising, other abnormalities noted to area.  Right hand dominant     Review of Systems  Constitutional: Negative for activity change, appetite change, chills, diaphoresis, fatigue and fever.  HENT: Negative for trouble swallowing and voice change.   Eyes: Negative for photophobia and visual disturbance.  Respiratory: Negative for cough, shortness of breath, wheezing and stridor.   Cardiovascular: Negative for chest pain.  Gastrointestinal: Negative for diarrhea, nausea and vomiting.  Endocrine: Negative for cold intolerance and heat intolerance.  Genitourinary: Negative for difficulty urinating and dysuria.  Musculoskeletal: Negative for arthralgias, back pain, gait problem, joint swelling, myalgias, neck pain and neck stiffness.  Skin: Negative for color change, pallor, rash and wound.  Allergic/Immunologic: Negative for food allergies.  Neurological: Negative for dizziness, tremors, seizures, syncope, facial asymmetry, speech difficulty, weakness, light-headedness, numbness and headaches.  Psychiatric/Behavioral: Negative for agitation, confusion and sleep disturbance.       Objective:   Physical Exam  Constitutional: She is oriented to person, place, and time. Vital signs are normal. She appears well-developed and well-nourished. No distress.  HENT:  Head: Normocephalic and atraumatic.  Right Ear: External ear normal.  Left Ear: External ear normal.  Nose: Nose normal.  Mouth/Throat: Oropharynx is clear and moist. No oropharyngeal exudate.  Eyes: Pupils  are equal, round, and reactive to light. Conjunctivae, EOM and lids are normal. Right eye exhibits no discharge. Left eye exhibits no discharge. No scleral icterus.  Neck: Trachea normal and normal range of motion. Neck supple. No tracheal deviation present.  Cardiovascular: Normal rate, regular rhythm, normal heart sounds and intact distal pulses.  Pulses:      Radial pulses are 2+ on the right side, and 2+ on the left side.  Pulmonary/Chest: Effort normal and breath sounds normal. No stridor. No respiratory distress. She has no wheezes. She has no rales.  Abdominal: Soft. She exhibits no distension. There is no guarding.  Musculoskeletal: Normal range of motion. She exhibits edema. She exhibits no tenderness or deformity.       Right shoulder: Normal.       Left shoulder: Normal.       Right elbow: Normal.      Left elbow: Normal.       Right wrist: Normal.       Left wrist: She exhibits swelling. She exhibits normal range of motion, no tenderness, no bony tenderness, no effusion, no crepitus, no deformity and no laceration.       Right hip: Normal.       Left hip: Normal.       Right knee: Normal.       Left knee: Normal.       Cervical back: Normal.       Thoracic back: Normal.       Lumbar back: Normal.       Arms:      Right hand: Normal.       Left hand: Normal.  Lymphadenopathy:       Head (right side): No submental, no submandibular, no preauricular and no occipital  adenopathy present.       Head (left side): No submental, no submandibular, no preauricular and no occipital adenopathy present.    She has no cervical adenopathy.       Right cervical: No superficial cervical adenopathy present.      Left cervical: No superficial cervical adenopathy present.  Neurological: She is alert and oriented to person, place, and time. She has normal strength. She is not disoriented. She displays no atrophy, no tremor and normal reflexes. No cranial nerve deficit or sensory deficit. She  exhibits normal muscle tone. She displays no seizure activity. Coordination and gait normal. GCS eye subscore is 4. GCS verbal subscore is 5. GCS motor subscore is 6.  5/5 hand grasp equal bilaterally; on/off exam table and in/out of chair without difficulty; gait sure and steady in hallway  Skin: Skin is warm, dry and intact. Capillary refill takes less than 2 seconds. No abrasion, no bruising, no burn, no ecchymosis, no laceration, no lesion, no petechiae and no rash noted. She is not diaphoretic. No cyanosis or erythema. No pallor. Nails show no clubbing.  Psychiatric: She has a normal mood and affect. Her speech is normal and behavior is normal. Judgment and thought content normal. Cognition and memory are normal.  Nursing note and vitals reviewed.    Measurement left forearm 1 inch superior to wrist 8.5 inches; right 8 inches and 3 inches superior to wrist bilaterally equal 8.5 inches     Assessment & Plan:  A-left upper extremity swelling  P-Will follow up with PCM at [facility] due to imaging cheaper with [facility] than [facility] facility and due to follow up for diabetes/new Rx.  consider imaging probable cyst but xray would rule out bony tumor and [place] rule out ganglion cyst.   do not believe it is blood clot or cellulitis as nontender, nonerythematous stable nontraumatic localized swelling over the past 3 weeks.  No crepitus with AROM full strength equal bilaterally.  Discussed signs and symptoms of infection and DVT with patient.  Elevation and ice or compression may help with localized edema.  Exitcare handout ganglion cyst, cellulitis and DVT Patient verbalized understanding of information/instructions, agreed with plan of care and had no further questions at this time.

## 2018-05-09 NOTE — Patient Instructions (Signed)
Deep Vein Thrombosis Deep vein thrombosis (DVT) is a condition in which a blood clot forms in a deep vein, such as a lower leg, thigh, or arm vein. A clot is blood that has thickened into a gel or solid. This condition is dangerous. It can lead to serious and even life-threatening complications if the clot travels to the lungs and causes a blockage (pulmonary embolism). It can also damage veins in the leg. This can result in leg pain, swelling, discoloration, and sores (post-thrombotic syndrome). What are the causes? This condition may be caused by:  A slowdown of blood flow.  Damage to a vein.  A condition that makes blood clot more easily.  What increases the risk? The following factors may make you more likely to develop this condition:  Being overweight.  Being elderly, especially over age 60.  Sitting or lying down for more than four hours.  Lack of physical activity (sedentary lifestyle).  Being pregnant, giving birth, or having recently given birth.  Taking medicines that contain estrogen.  Smoking.  A history of any of the following: ? Blood clots or blood clotting disease. ? Peripheral vascular disease. ? Inflammatory bowel disease. ? Cancer. ? Heart disease. ? Genetic conditions that affect how blood clots. ? Neurological diseases that affect the legs (leg paresis). ? Injury. ? Major or lengthy surgery. ? A central line placed inside a large vein.  What are the signs or symptoms? Symptoms of this condition include:  Swelling, pain, or tenderness in an arm or leg.  Warmth, redness, or discoloration in an arm or leg.  If the clot is in your leg, symptoms may be more noticeable or worse when you stand or walk. Some people do not have any symptoms. How is this diagnosed? This condition is diagnosed with:  A medical history.  A physical exam.  Tests, such as: ? Blood tests. These are done to see how your blood clots. ? Imaging tests. These are done to  check for clots. Tests may include:  Ultrasound.  CT scan.  MRI.  X-ray.  Venogram. For this test, X-rays are taken after a dye is injected into a vein.  How is this treated? Treatment for this condition depends on the cause, your risk for bleeding or developing more clots, and any medical conditions you have. Treatment may include:  Taking blood thinners (also called anticoagulants). These medicines may be taken by mouth, injected under the skin, or injected through an IV tube (catheter). These medicines prevent clots from forming.  Injecting medicine that dissolves blood clots into the affected vein (catheter-directed thrombolysis).  Having surgery. Surgery may be done to: ? Remove the clot. ? Place a filter in a large vein to catch blood clots before they reach the lungs.  Some treatments may be continued for up to six months. Follow these instructions at home: If you are taking an oral blood thinner:  Take the medicine exactly as told by your health care provider. Some blood thinners need to be taken at the same time every day. Do not skip a dose.  Ask your health care provider about what foods and drugs interact with the medicine.  Ask about possible side effects. General instructions  Blood thinners can cause easy bruising and difficulty stopping bleeding. Because of this, if you are taking or were given a blood thinner: ? Hold pressure over cuts for longer than usual. ? Tell your dentist and other health care providers that you are taking blood thinners before   having any procedures that can cause bleeding. ? Avoid contact sports.  Take over-the-counter and prescription medicines only as told by your health care provider.  Return to your normal activities as told by your health care provider. Ask your health care provider what activities are safe for you.  Wear compression stockings if recommended by your health care provider.  Keep all follow-up visits as told by  your health care provider. This is important. How is this prevented? To lower your risk of developing this condition again:  For 30 or more minutes every day, do an activity that: ? Involves moving your arms and legs. ? Increases your heart rate.  When traveling for longer than four hours: ? Exercise your arms and legs every hour. ? Drink plenty of water. ? Avoid drinking alcohol.  Avoid sitting or lying for a long time without moving your legs.  Stay a healthy weight.  If you are a woman who is older than age 29, avoid unnecessary use of medicines that contain estrogen.  Do not use any products that contain nicotine or tobacco, such as cigarettes and e-cigarettes. This is especially important if you take estrogen medicines. If you need help quitting, ask your health care provider.  Contact a health care provider if:  You miss a dose of your blood thinner.  You have nausea, vomiting, or diarrhea that lasts for more than one day.  Your menstrual period is heavier than usual.  You have unusual bruising. Get help right away if:  You have new or increased pain, swelling, or redness in an arm or leg.  You have numbness or tingling in an arm or leg.  You have shortness of breath.  You have chest pain.  You have a rapid or irregular heartbeat.  You feel light-headed or dizzy.  You cough up blood.  There is blood in your vomit, stool, or urine.  You have a serious fall or accident, or you hit your head.  You have a severe headache or confusion.  You have a cut that will not stop bleeding. These symptoms may represent a serious problem that is an emergency. Do not wait to see if the symptoms will go away. Get medical help right away. Call your local emergency services (911 in the [place] Do not drive yourself to the hospital. Summary  DVT is a condition in which a blood clot forms in a deep vein, such as a lower leg, thigh, or arm vein.  Symptoms can include swelling,  warmth, pain, and redness in your leg or arm.  Treatment may include taking blood thinners, injecting medicine that dissolves blood clots,wearing compression stockings, or surgery.  If you are prescribed blood thinners, take them exactly as told. This information is not intended to replace advice given to you by your health care provider. Make sure you discuss any questions you have with your health care provider. Document Released: 07/24/2005 Document Revised: 08/26/2016 Document Reviewed: 08/26/2016 Elsevier Interactive Patient Education  2018 [employer]. Ganglion Cyst A ganglion cyst is a noncancerous, fluid-filled lump that occurs near joints or tendons. The ganglion cyst grows out of a joint or the lining of a tendon. It most often develops in the hand or wrist, but it can also develop in the shoulder, elbow, hip, knee, ankle, or foot. The round or oval ganglion cyst can be the size of a pea or larger than a grape. Increased activity may enlarge the size of the cyst because more fluid starts to build  up. What are the causes? It is not known what causes a ganglion cyst to grow. However, it may be related to:  Inflammation or irritation around the joint.  An injury.  Repetitive movements or overuse.  Arthritis.  What increases the risk? Risk factors include:  Being a woman.  Being age 23-50.  What are the signs or symptoms? Symptoms may include:  A lump. This most often appears on the hand or wrist, but it can occur in other areas of the body.  Tingling.  Pain.  Numbness.  Muscle weakness.  Weak grip.  Less movement in a joint.  How is this diagnosed? Ganglion cysts are most often diagnosed based on a physical exam. Your health care provider will feel the lump and may shine a light alongside it. If it is a ganglion cyst, a light often shines through it. Your health care provider may order an X-ray, ultrasound, or MRI to rule out other conditions. How is this  treated? Ganglion cysts usually go away on their own without treatment. If pain or other symptoms are involved, treatment may be needed. Treatment is also needed if the ganglion cyst limits your movement or if it gets infected. Treatment may include:  Wearing a brace or splint on your wrist or finger.  Taking anti-inflammatory medicine.  Draining fluid from the lump with a needle (aspiration).  Injecting a steroid into the joint.  Surgery to remove the ganglion cyst.  Follow these instructions at home:  Do not press on the ganglion cyst, poke it with a needle, or hit it.  Take medicines only as directed by your health care provider.  Wear your brace or splint as directed by your health care provider.  Watch your ganglion cyst for any changes.  Keep all follow-up visits as directed by your health care provider. This is important. Contact a health care provider if:  Your ganglion cyst becomes larger or more painful.  You have increased redness, red streaks, or swelling.  You have pus coming from the lump.  You have weakness or numbness in the affected area.  You have a fever or chills. This information is not intended to replace advice given to you by your health care provider. Make sure you discuss any questions you have with your health care provider. Document Released: 07/21/2000 Document Revised: 12/30/2015 Document Reviewed: 01/06/2014 Elsevier Interactive Patient Education  2018 [employer] Cellulitis, Adult Cellulitis is a skin infection. The infected area is usually red and tender. This condition occurs most often in the arms and lower legs. The infection can travel to the muscles, blood, and underlying tissue and become serious. It is very important to get treated for this condition. What are the causes? Cellulitis is caused by bacteria. The bacteria enter through a break in the skin, such as a cut, burn, insect bite, open sore, or crack. What increases the  risk? This condition is more likely to occur in people who:  Have a weak defense system (immune system).  Have open wounds on the skin such as cuts, burns, bites, and scrapes. Bacteria can enter the body through these open wounds.  Are older.  Have diabetes.  Have a type of long-lasting (chronic) liver disease (cirrhosis) or kidney disease.  Use IV drugs.  What are the signs or symptoms? Symptoms of this condition include:  Redness, streaking, or spotting on the skin.  Swollen area of the skin.  Tenderness or pain when an area of the skin is touched.  Warm  skin.  Fever.  Chills.  Blisters.  How is this diagnosed? This condition is diagnosed based on a medical history and physical exam. You may also have tests, including:  Blood tests.  Lab tests.  Imaging tests.  How is this treated? Treatment for this condition may include:  Medicines, such as antibiotic medicines or antihistamines.  Supportive care, such as rest and application of cold or warm cloths (cold or warm compresses) to the skin.  Hospital care, if the condition is severe.  The infection usually gets better within 1-2 days of treatment. Follow these instructions at home:  Take over-the-counter and prescription medicines only as told by your health care provider.  If you were prescribed an antibiotic medicine, take it as told by your health care provider. Do not stop taking the antibiotic even if you start to feel better.  Drink enough fluid to keep your urine clear or pale yellow.  Do not touch or rub the infected area.  Raise (elevate) the infected area above the level of your heart while you are sitting or lying down.  Apply warm or cold compresses to the affected area as told by your health care provider.  Keep all follow-up visits as told by your health care provider. This is important. These visits let your health care provider make sure a more serious infection is not  developing. Contact a health care provider if:  You have a fever.  Your symptoms do not improve within 1-2 days of starting treatment.  Your bone or joint underneath the infected area becomes painful after the skin has healed.  Your infection returns in the same area or another area.  You notice a swollen bump in the infected area.  You develop new symptoms.  You have a general ill feeling (malaise) with muscle aches and pains. Get help right away if:  Your symptoms get worse.  You feel very sleepy.  You develop vomiting or diarrhea that persists.  You notice red streaks coming from the infected area.  Your red area gets larger or turns dark in color. This information is not intended to replace advice given to you by your health care provider. Make sure you discuss any questions you have with your health care provider. Document Released: 05/03/2005 Document Revised: 12/02/2015 Document Reviewed: 06/02/2015 Elsevier Interactive Patient Education  [employer].

## 2018-06-01 ENCOUNTER — Other Ambulatory Visit: Payer: Self-pay

## 2018-06-01 ENCOUNTER — Ambulatory Visit: Payer: PRIVATE HEALTH INSURANCE | Admitting: Family Medicine

## 2018-06-01 ENCOUNTER — Encounter: Payer: Self-pay | Admitting: Family Medicine

## 2018-06-01 VITALS — BP 134/79 | HR 78 | Temp 97.8°F | Resp 17 | Ht 64.0 in | Wt 165.0 lb

## 2018-06-01 DIAGNOSIS — E7800 Pure hypercholesterolemia, unspecified: Secondary | ICD-10-CM | POA: Diagnosis not present

## 2018-06-01 DIAGNOSIS — G44209 Tension-type headache, unspecified, not intractable: Secondary | ICD-10-CM

## 2018-06-01 DIAGNOSIS — R252 Cramp and spasm: Secondary | ICD-10-CM

## 2018-06-01 DIAGNOSIS — E119 Type 2 diabetes mellitus without complications: Secondary | ICD-10-CM | POA: Diagnosis not present

## 2018-06-01 MED ORDER — PRAVASTATIN SODIUM 20 MG PO TABS: 20.0000 mg | ORAL_TABLET | Freq: Every day | ORAL | 3 refills | Status: DC

## 2018-06-01 MED ORDER — METFORMIN HCL 500 MG PO TABS: 500.0000 mg | ORAL_TABLET | Freq: Two times a day (BID) | ORAL | 3 refills | Status: DC

## 2018-06-01 NOTE — Progress Notes (Signed)
Chief Complaint  Patient presents with  . medcation refill    gets meds free thru her company  . frontal headache pain and over eyes and back of head    feels this during her periods    HPI   Patient of Dr. Delman Cheadle She reports that she typically gets frontal headache Seems to get worse during her menstrual cycle She states that the headaches are usually like a pressure behind the eyes She takes tylenol for pain that is about 5/10 She had an eye exam a year ago and her exam was good and she only needed reading glasses She reports that she has been dealing with this for years She states that she might gets headaches every 15 days out of the month. She has never used Excedrin Headache   Diabetes Mellitus: Patient presents for follow up of diabetes. Symptoms: headaches. Symptoms have been intermittent. Patient denies foot ulcerations, hyperglycemia, hypoglycemia , increase appetite, nausea and paresthesia of the feet.  Evaluation to date has been included: hemoglobin A1C.  Home sugars: BGs consistently in an acceptable range. Treatment to date: Continued metformin which has been effective.  Lab Results  Component Value Date   HGBA1C 6.2 (H) 12/28/2017    She has a history of dyslipidemia She reports that takes simvastatin 51m She occasionally takes muscle cramps at night Lab Results  Component Value Date   CHOL 164 12/28/2017   CHOL 136 03/01/2017   CHOL 149 02/22/2016   Lab Results  Component Value Date   HDL 58 12/28/2017   HDL 45 03/01/2017   HDL 49 02/22/2016   Lab Results  Component Value Date   LDLCALC 86 12/28/2017   LDLCALC 61 03/01/2017   LDLCALC 70 02/22/2016   Lab Results  Component Value Date   TRIG 101 12/28/2017   TRIG 150 (H) 03/01/2017   TRIG 148 02/22/2016   Lab Results  Component Value Date   CHOLHDL 2.8 12/28/2017   CHOLHDL 3.0 03/01/2017   CHOLHDL 3.0 02/22/2016   No results found for: LDLDIRECT  Past Medical History:  Diagnosis Date    . Diabetes mellitus without complication ([place]   . Hyperlipidemia     Current Outpatient Medications  Medication Sig Dispense Refill  . aspirin EC 81 MG tablet Take 81 mg by mouth daily.    . Blood Glucose Monitoring Suppl (ONETOUCH VERIO) w/Device KIT 1 kit by Does not apply route as directed. 1 kit 0  . glucose blood (ONETOUCH VERIO) test strip Use as instructed 100 each 12  . IRON, FERROUS SULFATE, PO Take 1 tablet by mouth as needed.    . Lancets 28G MISC 1 Device by Does not apply route 4 (four) times daily as needed. 100 each 3  . metFORMIN (GLUCOPHAGE) 500 MG tablet Take 1 tablet (500 mg total) by mouth 2 (two) times daily with a meal. 180 tablet 3  . pravastatin (PRAVACHOL) 20 MG tablet Take 1 tablet (20 mg total) by mouth daily. 90 tablet 3   No current facility-administered medications for this visit.     Allergies: No Known Allergies  Past Surgical History:  Procedure Laterality Date  . HERNIA REPAIR    . SPINE SURGERY  Disk surgery    Social History   Socioeconomic History  . Marital status: Married    Spouse name: Not on file  . Number of children: Not on file  . Years of education: Not on file  . Highest education level: Not on file  Occupational History  . Not on file  Social Needs  . Financial resource strain: Not on file  . Food insecurity:    Worry: Not on file    Inability: Not on file  . Transportation needs:    Medical: Not on file    Non-medical: Not on file  Tobacco Use  . Smoking status: Never Smoker  . Smokeless tobacco: Never Used  Substance and Sexual Activity  . Alcohol use: No    Alcohol/week: 0.0 standard drinks  . Drug use: No  . Sexual activity: Not on file  Lifestyle  . Physical activity:    Days per week: Not on file    Minutes per session: Not on file  . Stress: Not on file  Relationships  . Social connections:    Talks on phone: Not on file    Gets together: Not on file    Attends religious service: Not on file     Active member of club or organization: Not on file    Attends meetings of clubs or organizations: Not on file    Relationship status: Not on file  Other Topics Concern  . Not on file  Social History Narrative  . Not on file    Family History  Problem Relation Age of Onset  . Diabetes Father   . Breast cancer Neg Hx   . Colon cancer Neg Hx      ROS Review of Systems See HPI Constitution: No fevers or chills No malaise No diaphoresis Skin: No rash or itching Eyes: no blurry vision, no double vision GU: no dysuria or hematuria Neuro: no dizziness or headaches * all others reviewed and negative   Objective: Vitals:   06/01/18 0945  BP: 134/79  Pulse: 78  Resp: 17  Temp: 97.8 F (36.6 C)  TempSrc: Oral  SpO2: 97%  Weight: 165 lb (74.8 kg)  Height: _0  (1.626 m)    Physical Exam  Constitutional: She is oriented to person, place, and time. She appears well-developed and well-nourished.  HENT:  Head: Normocephalic and atraumatic.  Right Ear: External ear normal.  Left Ear: External ear normal.  Mouth/Throat: Oropharynx is clear and moist.  Eyes: Pupils are equal, round, and reactive to light. Conjunctivae and EOM are normal. Right eye exhibits no discharge.  Fundoscopic exam:      The right eye shows no AV nicking, no exudate, no hemorrhage and no papilledema.       The left eye shows no AV nicking, no exudate, no hemorrhage and no papilledema.  Neck: Normal range of motion. Neck supple.  TMJ with palpable click with chewing motion  Cardiovascular: Normal rate, regular rhythm and normal heart sounds.  No murmur heard. Pulmonary/Chest: Effort normal and breath sounds normal. No stridor. No respiratory distress. She has no wheezes.  Abdominal: Soft. Bowel sounds are normal. She exhibits no distension and no mass. There is no tenderness. There is no rebound and no guarding. No hernia.  Musculoskeletal: She exhibits no edema.  Neurological: She is alert and oriented  to person, place, and time.  Skin: Skin is warm. Capillary refill takes less than 2 seconds.  Psychiatric: She has a normal mood and affect. Her behavior is normal. Judgment and thought content normal.    Assessment and Plan Mallory was seen today for medcation refill and frontal headache pain and over eyes and back of head.  Diagnoses and all orders for this visit:  Controlled type 2 diabetes mellitus without complication, without  long-term current use of insulin ([place])- previously well controlled.  -     HM Diabetes Foot Exam -     Microalbumin, urine -     Comprehensive metabolic panel -     Hemoglobin A1c -     metFORMIN (GLUCOPHAGE) 500 MG tablet; Take 1 tablet (500 mg total) by mouth 2 (two) times daily with a meal.  Pure hypercholesterolemia- will assess today Showing good control Will check lipid levels and liver enzymes -     Comprehensive metabolic panel -     Lipid panel -     pravastatin (PRAVACHOL) 20 MG tablet; Take 1 tablet (20 mg total) by mouth daily.  Muscle cramps at night- changed statin from simvastatin to pravastain   Tension headache- advised to try excedrin headache Concerning also for TMJ given that there is a palpable click with jaw range of motion      Sajan Cheatwood A Nolon Rod

## 2018-06-01 NOTE — Patient Instructions (Addendum)
1. Try Excedrin HA for headaches   2. I have changed your medication from simvastatin to pravastatin and lowered to dose to cut down on the cramps.  3. Drink plenty of water and take a multivitamin    If you have lab work done today you will be contacted with your lab results within the next 2 weeks.  If you have not heard from [place] then please contact us. The fastest way to get your results is to register for My Chart.   IF you received an x-ray today, you will receive an invoice from [facility]. Please contact [facility] at [phone] with questions or concerns regarding your invoice.   IF you received labwork today, you will receive an invoice from [place]. Please contact [facility] at [phone] with questions or concerns regarding your invoice.   Our billing staff will not be able to assist you with questions regarding bills from these companies.  You will be contacted with the lab results as soon as they are available. The fastest way to get your results is to activate your My Chart account. Instructions are located on the last page of this paperwork. If you have not heard from [place] regarding the results in 2 weeks, please contact this office.     Tension Headache A tension headache is a feeling of pain, pressure, or aching that is often felt over the front and sides of the head. The pain can be dull, or it can feel tight (constricting). Tension headaches are not normally associated with nausea or vomiting, and they do not get worse with physical activity. Tension headaches can last from 30 minutes to several days. This is the most common type of headache. CAUSES The exact cause of this condition is not known. Tension headaches often begin after stress, anxiety, or depression. Other triggers may include:  Alcohol.  Too much caffeine, or caffeine withdrawal.  Respiratory infections, such as colds, flu, or sinus infections.  Dental problems or teeth  clenching.  Fatigue.  Holding your head and neck in the same position for a long period of time, such as while using a computer.  Smoking. SYMPTOMS Symptoms of this condition include:  A feeling of pressure around the head.  Dull, aching head pain.  Pain felt over the front and sides of the head.  Tenderness in the muscles of the head, neck, and shoulders. DIAGNOSIS This condition may be diagnosed based on your symptoms and a physical exam. Tests may be done, such as a CT scan or an MRI of your head. These tests may be done if your symptoms are severe or unusual. TREATMENT This condition may be treated with lifestyle changes and medicines to help relieve symptoms. HOME CARE INSTRUCTIONS Managing Pain  Take over-the-counter and prescription medicines only as told by your health care provider.  Lie down in a dark, quiet room when you have a headache.  If directed, apply ice to the head and neck area: ? Put ice in a plastic bag. ? Place a towel between your skin and the bag. ? Leave the ice on for 20 minutes, 2-3 times per day.  Use a heating pad or a hot shower to apply heat to the head and neck area as told by your health care provider. Eating and Drinking  Eat meals on a regular schedule.  Limit alcohol use.  Decrease your caffeine intake, or stop using caffeine. General Instructions  Keep all follow-up visits as told by your health care provider. This  is important.  Keep a headache journal to help find out what may trigger your headaches. For example, write down: ? What you eat and drink. ? How much sleep you get. ? Any change to your diet or medicines.  Try massage or other relaxation techniques.  Limit stress.  Sit up straight, and avoid tensing your muscles.  Do not use tobacco products, including cigarettes, chewing tobacco, or e-cigarettes. If you need help quitting, ask your health care provider.  Exercise regularly as told by your health care  provider.  Get 7-9 hours of sleep, or the amount recommended by your health care provider. SEEK MEDICAL CARE IF:  Your symptoms are not helped by medicine.  You have a headache that is different from what you normally experience.  You have nausea or you vomit.  You have a fever. SEEK IMMEDIATE MEDICAL CARE IF:  Your headache becomes severe.  You have repeated vomiting.  You have a stiff neck.  You have a loss of vision.  You have problems with speech.  You have pain in your eye or ear.  You have muscular weakness or loss of muscle control.  You lose your balance or you have trouble walking.  You feel faint or you pass out.  You have confusion. This information is not intended to replace advice given to you by your health care provider. Make sure you discuss any questions you have with your health care provider. Document Released: 07/24/2005 Document Revised: 04/14/2015 Document Reviewed: 11/16/2014 Elsevier Interactive Patient Education  2017 [employer] Temporomandibular Joint Syndrome Temporomandibular joint (TMJ) syndrome is a condition that affects the joints between your jaw and your skull. The TMJs are located near your ears and allow your jaw to open and close. These joints and the nearby muscles are involved in all movements of the jaw. People with TMJ syndrome have pain in the area of these joints and muscles. Chewing, biting, or other movements of the jaw can be difficult or painful. TMJ syndrome can be caused by various things. In many cases, the condition is mild and goes away within a few weeks. For some people, the condition can become a long-term problem. What are the causes? Possible causes of TMJ syndrome include:  Grinding your teeth or clenching your jaw. Some people do this when they are under stress.  Arthritis.  Injury to the jaw.  Head or neck injury.  Teeth or dentures that are not aligned well.  In some cases, the cause of TMJ syndrome  may not be known. What are the signs or symptoms? The most common symptom is an aching pain on the side of the head in the area of the TMJ. Other symptoms may include:  Pain when moving your jaw, such as when chewing or biting.  Being unable to open your jaw all the way.  Making a clicking sound when you open your mouth.  Headache.  Earache.  Neck or shoulder pain.  How is this diagnosed? Diagnosis can usually be made based on your symptoms, your medical history, and a physical exam. Your health care provider may check the range of motion of your jaw. Imaging tests, such as X-rays or an MRI, are sometimes done. You may need to see your dentist to determine if your teeth and jaw are lined up correctly. How is this treated? TMJ syndrome often goes away on its own. If treatment is needed, the options may include:  Eating soft foods and applying ice or heat.  Medicines  to relieve pain or inflammation.  Medicines to relax the muscles.  A splint, bite plate, or mouthpiece to prevent teeth grinding or jaw clenching.  Relaxation techniques or counseling to help reduce stress.  Transcutaneous electrical nerve stimulation (TENS). This helps to relieve pain by applying an electrical current through the skin.  Acupuncture. This is sometimes helpful to relieve pain.  Jaw surgery. This is rarely needed.  Follow these instructions at home:  Take medicines only as directed by your health care provider.  Eat a soft diet if you are having trouble chewing.  Apply ice to the painful area. ? Put ice in a plastic bag. ? Place a towel between your skin and the bag. ? Leave the ice on for 20 minutes, 2-3 times a day.  Apply a warm compress to the painful area as directed.  Massage your jaw area and perform any jaw stretching exercises as recommended by your health care provider.  If you were given a mouthpiece or bite plate, wear it as directed.  Avoid foods that require a lot of  chewing. Do not chew gum.  Keep all follow-up visits as directed by your health care provider. This is important. Contact a health care provider if:  You are having trouble eating.  You have new or worsening symptoms. Get help right away if:  Your jaw locks open or closed. This information is not intended to replace advice given to you by your health care provider. Make sure you discuss any questions you have with your health care provider. Document Released: 04/18/2001 Document Revised: 03/23/2016 Document Reviewed: 02/26/2014 Elsevier Interactive Patient Education  [employer].

## 2018-06-02 LAB — COMPREHENSIVE METABOLIC PANEL
A/G RATIO: 1.8 (ref 1.2–2.2)
ALT: 16 IU/L (ref 0–32)
AST: 20 IU/L (ref 0–40)
Albumin: 4.6 g/dL (ref 3.5–5.5)
Alkaline Phosphatase: 48 IU/L (ref 39–117)
BILIRUBIN TOTAL: 0.7 mg/dL (ref 0.0–1.2)
BUN/Creatinine Ratio: 16 (ref 9–23)
BUN: 11 mg/dL (ref 6–24)
CHLORIDE: 103 mmol/L (ref 96–106)
CO2: 23 mmol/L (ref 20–29)
Calcium: 9.4 mg/dL (ref 8.7–10.2)
Creatinine, Ser: 0.7 mg/dL (ref 0.57–1.00)
GFR calc Af Amer: 114 mL/min/{1.73_m2} (ref >59)
GFR calc non Af Amer: 99 mL/min/{1.73_m2} (ref >59)
GLOBULIN, TOTAL: 2.5 g/dL (ref 1.5–4.5)
Glucose: 121 mg/dL — ABNORMAL HIGH (ref 65–99)
POTASSIUM: 4.5 mmol/L (ref 3.5–5.2)
SODIUM: 143 mmol/L (ref 134–144)
Total Protein: 7.1 g/dL (ref 6.0–8.5)

## 2018-06-02 LAB — LIPID PANEL
Chol/HDL Ratio: 3.5 ratio (ref 0.0–4.4)
Cholesterol, Total: 181 mg/dL (ref 100–199)
HDL: 51 mg/dL (ref >39)
LDL Calculated: 97 mg/dL (ref 0–99)
TRIGLYCERIDES: 163 mg/dL — AB (ref 0–149)
VLDL Cholesterol Cal: 33 mg/dL (ref 5–40)

## 2018-06-02 LAB — HEMOGLOBIN A1C
Est. average glucose Bld gHb Est-mCnc: 143 mg/dL
Hgb A1c MFr Bld: 6.6 % — ABNORMAL HIGH (ref 4.8–5.6)

## 2018-06-18 ENCOUNTER — Encounter: Payer: Self-pay | Admitting: Registered Nurse

## 2018-06-18 ENCOUNTER — Ambulatory Visit: Payer: Self-pay | Admitting: Registered Nurse

## 2018-06-18 VITALS — BP 132/88 | HR 80 | Temp 97.7°F

## 2018-06-18 DIAGNOSIS — W57XXXA Bitten or stung by nonvenomous insect and other nonvenomous arthropods, initial encounter: Principal | ICD-10-CM

## 2018-06-18 DIAGNOSIS — S50861A Insect bite (nonvenomous) of right forearm, initial encounter: Secondary | ICD-10-CM

## 2018-06-18 MED ORDER — BACITRACIN-NEOMYCIN-POLYMYXIN 400-5-5000 EX OINT: 1.0000 "application " | TOPICAL_OINTMENT | Freq: Two times a day (BID) | CUTANEOUS | 0 refills | Status: AC

## 2018-06-18 MED ORDER — DIPHENHYDRAMINE HCL 2 % EX GEL: 1.0000 "application " | Freq: Two times a day (BID) | CUTANEOUS | 0 refills | Status: AC | PRN

## 2018-06-18 MED ORDER — SULFAMETHOXAZOLE-TRIMETHOPRIM 800-160 MG PO TABS: 1.0000 | ORAL_TABLET | Freq: Two times a day (BID) | ORAL | 0 refills | Status: AC

## 2018-06-18 NOTE — Progress Notes (Signed)
Subjective:    Patient ID: [name], female    DOB: [birth year], 52 y.o.   MRN: [id number]  52y/o [place] established female pt c/o R FA swelling, erythema, soreness x3 days. ?insect bite she did not see insect on her skin only pimple with swelling/soreness.  RN Hildred Alamin noted Cyst palpable under skin.yesterday.   Pt denies drainage since yesterday when nurse squeezed pimple. No home treatment. In clinic yesterday RN applied triple abx ointment and band-aid to keep covered while at work. PMHx diabetes     Review of Systems  Constitutional: Negative for activity change, appetite change, chills, diaphoresis, fatigue and fever.  HENT: Negative for trouble swallowing and voice change.   Eyes: Negative for photophobia and visual disturbance.  Respiratory: Negative for cough, choking, chest tightness, shortness of breath, wheezing and stridor.   Cardiovascular: Negative for chest pain and leg swelling.  Gastrointestinal: Negative for abdominal pain, diarrhea, nausea and vomiting.  Endocrine: Negative for cold intolerance and heat intolerance.  Genitourinary: Negative for difficulty urinating and dysuria.  Musculoskeletal: Positive for myalgias. Negative for arthralgias, back pain, gait problem, joint swelling, neck pain and neck stiffness.  Skin: Positive for color change and rash. Negative for pallor and wound.  Allergic/Immunologic: Positive for immunocompromised state. Negative for environmental allergies and food allergies.  Neurological: Negative for tremors, weakness, numbness and headaches.  Hematological: Negative for adenopathy. Does not bruise/bleed easily.  Psychiatric/Behavioral: Negative for agitation, confusion and sleep disturbance. The patient is not nervous/anxious.        Objective:   Physical Exam  Constitutional: She is oriented to person, place, and time. Vital signs are normal. She appears well-developed and well-nourished. She is active and cooperative.  Non-toxic  appearance. She does not have a sickly appearance. She does not appear ill. No distress.  HENT:  Head: Normocephalic and atraumatic.  Right Ear: Hearing and external ear normal.  Left Ear: Hearing and external ear normal.  Nose: Nose normal.  Mouth/Throat: Uvula is midline, oropharynx is clear and moist and mucous membranes are normal. No oropharyngeal exudate. No tonsillar exudate.  Eyes: Pupils are equal, round, and reactive to light. Conjunctivae, EOM and lids are normal. Right eye exhibits no discharge. Left eye exhibits no discharge. No scleral icterus.  Neck: Trachea normal, normal range of motion and phonation normal. Neck supple. No tracheal deviation present.  Cardiovascular: Normal rate, regular rhythm, normal heart sounds and intact distal pulses.  Pulses:      Radial pulses are 2+ on the right side, and 2+ on the left side.  Pulmonary/Chest: Effort normal and breath sounds normal. No stridor. No respiratory distress. She has no decreased breath sounds. She has no wheezes. She has no rhonchi. She has no rales.  No cough observed in exam room; spoke full sentences without difficulty  Abdominal: Soft. Normal appearance. She exhibits no distension. There is no rigidity and no guarding.  Musculoskeletal: Normal range of motion. She exhibits edema and tenderness. She exhibits no deformity.       Right shoulder: Normal.       Left shoulder: Normal.       Right elbow: Normal.      Left elbow: Normal.       Right wrist: Normal.       Left wrist: Normal.       Right hip: Normal.       Left hip: Normal.       Right knee: Normal.       Left knee: Normal.  Cervical back: Normal.       Thoracic back: Normal.       Lumbar back: Normal.       Right forearm: She exhibits tenderness, swelling and edema. She exhibits no bony tenderness, no deformity and no laceration.       Left forearm: Normal.       Arms:      Right hand: Normal.       Left hand: Normal.  1cm induration right  anterior forearm slightly TTP no fluctuance 0-1+/4 edema localized  Lymphadenopathy:       Head (right side): No submental, no submandibular, no tonsillar, no preauricular, no posterior auricular and no occipital adenopathy present.       Head (left side): No submental, no submandibular, no tonsillar, no preauricular, no posterior auricular and no occipital adenopathy present.    She has no cervical adenopathy.       Right cervical: No superficial cervical, no deep cervical and no posterior cervical adenopathy present.      Left cervical: No superficial cervical, no deep cervical and no posterior cervical adenopathy present.  Neurological: She is alert and oriented to person, place, and time. She has normal strength. She is not disoriented. She displays no atrophy and no tremor. No cranial nerve deficit or sensory deficit. She exhibits normal muscle tone. She displays no seizure activity. Coordination and gait normal. GCS eye subscore is 4. GCS verbal subscore is 5. GCS motor subscore is 6.  In/out of chair/on/off exam table without difficulty; gait sure and steady in hallway  Skin: Skin is warm, dry and intact. Capillary refill takes less than 2 seconds. Rash noted. No abrasion, no bruising, no burn, no ecchymosis, no laceration, no lesion, no petechiae and no purpura noted. Rash is papular and nodular. Rash is not macular, not maculopapular, not pustular, not vesicular and not urticarial. She is not diaphoretic. No cyanosis or erythema. No pallor. Nails show no clubbing.  Papule right forearm tan opaque discharge on bandaid gauze scant and induration 1 inch diameter around papule no fluctuance slightly tender to palpation; removed bandaid for evaluation and replaced with clean dry  Psychiatric: She has a normal mood and affect. Her speech is normal and behavior is normal. Judgment and thought content normal. Cognition and memory are normal.  Nursing note and vitals reviewed.         Assessment &  Plan:  A-insect bite initial visit right forearm  P-Solitary lesion did not require oral steroids.  Dispensed bactrim DS po BID x 7 days #40 RF0 from [facility] to patient start if worsening pain/rash/redness/worsening discharge.  Given calagel 2% apply BID prn itching and triple antibiotic topical BID with dressing changes and after washing with soap and water.  Avoid scratching.  May apply ice 15 minutes po TID prn pain/swelling.  Exitcare handout on skin infection and insect bite. RTC if worsening erythema, pain, purulent discharge, fever. Wash towels, washcloths, sheets in hot water with bleach every couple of days until infection resolved. Wash area with soap and water at least daily.  Apply triple antibiotic or bactroban daily to BID with dressing change.  Cover with bandage until healed over.  Given bandaids and triple antibiotic ointment from clinic stock.   Patient verbalized understanding, agreed with plan of care and had no further questions at this time.

## 2018-06-18 NOTE — Patient Instructions (Signed)
Cellulitis, Adult Cellulitis is a skin infection. The infected area is usually red and tender. This condition occurs most often in the arms and lower legs. The infection can travel to the muscles, blood, and underlying tissue and become serious. It is very important to get treated for this condition. What are the causes? Cellulitis is caused by bacteria. The bacteria enter through a break in the skin, such as a cut, burn, insect bite, open sore, or crack. What increases the risk? This condition is more likely to occur in people who:  Have a weak defense system (immune system).  Have open wounds on the skin such as cuts, burns, bites, and scrapes. Bacteria can enter the body through these open wounds.  Are older.  Have diabetes.  Have a type of long-lasting (chronic) liver disease (cirrhosis) or kidney disease.  Use IV drugs.  What are the signs or symptoms? Symptoms of this condition include:  Redness, streaking, or spotting on the skin.  Swollen area of the skin.  Tenderness or pain when an area of the skin is touched.  Warm skin.  Fever.  Chills.  Blisters.  How is this diagnosed? This condition is diagnosed based on a medical history and physical exam. You may also have tests, including:  Blood tests.  Lab tests.  Imaging tests.  How is this treated? Treatment for this condition may include:  Medicines, such as antibiotic medicines or antihistamines.  Supportive care, such as rest and application of cold or warm cloths (cold or warm compresses) to the skin.  Hospital care, if the condition is severe.  The infection usually gets better within 1-2 days of treatment. Follow these instructions at home:  Take over-the-counter and prescription medicines only as told by your health care provider.  If you were prescribed an antibiotic medicine, take it as told by your health care provider. Do not stop taking the antibiotic even if you start to feel  better.  Drink enough fluid to keep your urine clear or pale yellow.  Do not touch or rub the infected area.  Raise (elevate) the infected area above the level of your heart while you are sitting or lying down.  Apply warm or cold compresses to the affected area as told by your health care provider.  Keep all follow-up visits as told by your health care provider. This is important. These visits let your health care provider make sure a more serious infection is not developing. Contact a health care provider if:  You have a fever.  Your symptoms do not improve within 1-2 days of starting treatment.  Your bone or joint underneath the infected area becomes painful after the skin has healed.  Your infection returns in the same area or another area.  You notice a swollen bump in the infected area.  You develop new symptoms.  You have a general ill feeling (malaise) with muscle aches and pains. Get help right away if:  Your symptoms get worse.  You feel very sleepy.  You develop vomiting or diarrhea that persists.  You notice red streaks coming from the infected area.  Your red area gets larger or turns dark in color. This information is not intended to replace advice given to you by your health care provider. Make sure you discuss any questions you have with your health care provider. Document Released: 05/03/2005 Document Revised: 12/02/2015 Document Reviewed: 06/02/2015 Elsevier Interactive Patient Education  2018 [place], Adult An insect bite can make your skin  red, itchy, and swollen. An insect bite is different from an insect sting, which happens when an insect injects poison (venom) into the skin. Some insects can spread disease to people through a bite. However, most insect bites do not lead to disease and are not serious. What are the causes? Insects may bite for a variety of reasons, including:  Hunger.  To defend themselves.  Insects that bite  include:  Spiders.  Mosquitoes.  Ticks.  Fleas.  Ants.  Flies.  Bedbugs.  What are the signs or symptoms? Symptoms of this condition include:  Itching or pain in the bite area.  Redness and swelling in the bite area.  An open wound (skin ulcer).  In many cases, symptoms last for 2-4 days. How is this diagnosed? This condition is usually diagnosed based on symptoms and a physical exam. How is this treated? Treatment is usually not needed. Symptoms often go away on their own. When treatment is recommended, it may involve:  Applying a cream or lotion to the bitten area. This treatment helps with itching.  Taking an antibiotic medicine. This treatment is needed if the bite area gets infected.  Getting a tetanus shot.  Applying ice to the affected area.  Medicines called antihistamines. This treatment is needed if you develop an allergic reaction to the insect bite.  Follow these instructions at home: Bite area care  Do not scratch the bite area.  Keep the bite area clean and dry. Wash it every day with soap and water as told by your health care provider.  Check the bite area every day for signs of infection. Check for: ? More redness, swelling, or pain. ? Fluid or blood. ? Warmth. ? Pus. Managing pain, itching, and swelling   You may apply a baking soda paste, cortisone cream, or calamine lotion to the bite area as told by your health care provider.  If directed, applyice to the bite area. ? Put ice in a plastic bag. ? Place a towel between your skin and the bag. ? Leave the ice on for 20 minutes, 2-3 times per day. Medicines  Apply or take over-the-counter and prescription medicines only as told by your health care provider.  If you were prescribed an antibiotic medicine, use it as told by your health care provider. Do not stop using the antibiotic even if your condition improves. General instructions  Keep all follow-up visits as told by your health  care provider. This is important. How is this prevented? To help reduce your risk of insect bites:  When you are outdoors, wear clothing that covers your arms and legs.  Use insect repellent. The best insect repellents contain: ? DEET, picaridin, oil of lemon eucalyptus (OLE), or IR3535. ? Higher amounts of an active ingredient.  If your home windows do not have screens, consider installing them.  Contact a health care provider if:  You have more redness, swelling, or pain in the bite area.  You have fluid, blood, or pus coming from the bite area.  The bite area feels warm to the touch.  You have a fever. Get help right away if:  You have joint pain.  You have a rash.  You have shortness of breath.  You feel unusually tired or sleepy.  You have neck pain.  You have a headache.  You have unusual weakness.  You have chest pain.  You have nausea, vomiting, or pain in the abdomen. This information is not intended to replace advice given to  you by your health care provider. Make sure you discuss any questions you have with your health care provider. Document Released: 08/31/2004 Document Revised: 03/22/2016 Document Reviewed: 01/31/2016 Elsevier Interactive Patient Education  [employer].

## 2018-06-21 ENCOUNTER — Telehealth: Payer: Self-pay | Admitting: *Deleted

## 2018-06-21 MED ORDER — OSELTAMIVIR PHOSPHATE 75 MG PO CAPS: 75.0000 mg | ORAL_CAPSULE | Freq: Two times a day (BID) | ORAL | 0 refills | Status: AC

## 2018-06-21 NOTE — Telephone Encounter (Signed)
Pt in to clinic this am c/o generalized body aches, fatigue, sore throat, dry cough since last night. Temp checked and found to be 100.4. No meds on board currently. Reviewed cold vs flu sx with pt. Denies sinus pain/pressure, nasal or chest congestion, otalgia. Reconciled current med list with pt. No interactions per UTD with Tamiflu. Offered Tamiflu as antiviral course of treatment per standing orders. Pt accepted and pharmacy of choice confirmed. Sx treatment reviewed including alternating Tylenol 1000mg  and Ibuprofen 800mg  po q8h given from clinic OTC stock, honey/lemon tea and cough drops/lozenges from clinic stock for sore throat, chloraseptic spray for sore throat, Guaifenesin if cough becomes productive. Pt also provided with face mask explaining transmission of viral illness. Advised her to speak with her supervisor and alert them to her fever and illness to discuss her work attendance for the rest of the day. Pt reports if she gets to feeling worse, she will speak with them. Pt advised of clinic staff preferences of employees not returning to work until fever free without antipyretics 24 hours, but to f/u with supervisor as out of work and return to work permissions are outside [employer] scope. Pt verbalizes understanding and agreement.  Approx 64min later, pt's son called clinic to check status of Rx to [facility] as pt had called him to pick her up from work, deciding to go home shortly after leaving clinic.

## 2018-06-25 NOTE — Telephone Encounter (Signed)
Noted agree with plan of care and Rx signed for Tamiflu 75mg  po BID x 5 days #10 RF0

## 2018-10-10 ENCOUNTER — Telehealth: Payer: Self-pay | Admitting: *Deleted

## 2018-10-10 NOTE — Telephone Encounter (Signed)
Pt reports she was switched to Pravastatin 20mg  daily from Simvastatin 40mg  daily in Oct 2019 after having mild leg cramps. She reports she stopped taking Pravastatin in Dec 2019 2/2 severe joint and leg aches.  Due to language barrier as ESL and strong accent, pt requested I contact pcp office and ask if she can be switched back to Simvastatin as he can tolerate the mild side effects better than the Pravastatin side effects. She would like to try taking a reduced dose of 20mg  to see if this helps curb the leg cramps she was having on it previously. If this is appropriate, please enter a new Rx to preferred pharmacy of [facility] where pt can receive med at no cost through employer. (Will default to a printed Rx, but this may be shredded as [facility] can view in [facility].)

## 2018-10-15 MED ORDER — SIMVASTATIN 20 MG PO TABS: 20.0000 mg | ORAL_TABLET | ORAL | 1 refills | Status: DC

## 2018-10-15 NOTE — Telephone Encounter (Signed)
Got it. Thank you.

## 2018-10-15 NOTE — Telephone Encounter (Signed)
Rx has been faxed.

## 2018-10-15 NOTE — Telephone Encounter (Signed)
Dr. Nolon Rod, I've tried to route this request through the pec pool as directed by the front desk but no response. Pt has been out of medication since last week. Could you please review my 3/5 note below regarding her request to switch back to Simvastatin? If you approve, I can place the order for you to the appropriate pharmacy so we can get it dispensed for her today while our NP is in clinic. Thank you!

## 2019-02-17 ENCOUNTER — Telehealth: Payer: Self-pay | Admitting: *Deleted

## 2019-02-17 NOTE — Telephone Encounter (Signed)
Good morning. Dr. Nolon Rod changed pt's statin from Pravastatin to Simvastatin back in March due to side effects. She was started on a reduced dose of 20mg  every other day. She just informed me that she began taking every day as she was tolerating new med well and is now out of pills. If appropriate, could a new prescription be entered for daily 20mg  dosing so we can get that filled for her through our dispensary?  Rx can be entered under preferred pharmacy of [facility]. It will default to "Print" from e-Rx but the printed Rx can be shredded as I will be able to view the order in [facility]. Thank you.

## 2019-02-22 NOTE — Telephone Encounter (Signed)
Dr Stallings advise please.   

## 2019-03-04 NOTE — Telephone Encounter (Signed)
Still needing new Rx. Please advise.

## 2019-03-05 NOTE — Telephone Encounter (Signed)
Attempted to call pharamcy and [name] and there was no answer. I have left a message to call back.  Plan: to let [name] know that the she has not been seen since 10/2017 and will need an appointment for follow up and get additional medications.  I do not mind giving her a curtsy 30 day refill, however I do want to talk to the [name] first.

## 2019-03-13 NOTE — Telephone Encounter (Signed)
I have not received a phone call or mesg from [facility] regarding this Rx. Direct line if needed is [phone] ext [phone]. We have bridge refilled her for 90 days as we do not carry 30 day supplies. I will inform pt that she needs to call your office for an appt for future refills. Thank you.

## 2019-03-14 NOTE — Telephone Encounter (Signed)
Noted thanks °

## 2019-04-01 ENCOUNTER — Ambulatory Visit: Payer: PRIVATE HEALTH INSURANCE | Admitting: Family Medicine

## 2019-05-27 ENCOUNTER — Encounter: Payer: Self-pay | Admitting: Family Medicine

## 2019-05-27 ENCOUNTER — Other Ambulatory Visit: Payer: Self-pay

## 2019-05-27 ENCOUNTER — Ambulatory Visit: Payer: PRIVATE HEALTH INSURANCE | Admitting: Family Medicine

## 2019-05-27 VITALS — BP 120/73 | HR 74 | Temp 98.2°F | Wt 163.0 lb

## 2019-05-27 DIAGNOSIS — E119 Type 2 diabetes mellitus without complications: Secondary | ICD-10-CM

## 2019-05-27 DIAGNOSIS — Z23 Encounter for immunization: Secondary | ICD-10-CM

## 2019-05-27 DIAGNOSIS — E7800 Pure hypercholesterolemia, unspecified: Secondary | ICD-10-CM

## 2019-05-27 MED ORDER — SIMVASTATIN 20 MG PO TABS: 20.0000 mg | ORAL_TABLET | Freq: Every day | ORAL | 1 refills | Status: DC

## 2019-05-27 MED ORDER — METFORMIN HCL 500 MG PO TABS: 500.0000 mg | ORAL_TABLET | Freq: Two times a day (BID) | ORAL | 1 refills | Status: DC

## 2019-05-27 MED ORDER — SIMVASTATIN 20 MG PO TABS: 20.0000 mg | ORAL_TABLET | ORAL | 1 refills | Status: DC

## 2019-05-27 NOTE — Patient Instructions (Signed)
° ° ° °  If you have lab work done today you will be contacted with your lab results within the next 2 weeks.  If you have not heard from us then please contact us. The fastest way to get your results is to register for My Chart. ° ° °IF you received an x-ray today, you will receive an invoice from [facility]. Please contact [facility] at [phone] with questions or concerns regarding your invoice.  ° °IF you received labwork today, you will receive an invoice from [facility]. Please contact [facility] at [phone] with questions or concerns regarding your invoice.  ° °Our billing staff will not be able to assist you with questions regarding bills from these companies. ° °You will be contacted with the lab results as soon as they are available. The fastest way to get your results is to activate your My Chart account. Instructions are located on the last page of this paperwork. If you have not heard from us regarding the results in 2 weeks, please contact this office. °  ° ° ° °

## 2019-05-27 NOTE — Progress Notes (Signed)
10/20/20209:19 AM  [name] Mar 12, 1966, 53 y.o., female [id number]  Chief Complaint  Patient presents with  . Chronic medical Condition    f/u on her chronic medical condition and go over lab results    HPI:   Patient is a 53 y.o. female with past medical history significant for DM2, HLP, chronic tension headaches,  who presents today for followup  PCP Dr Nolon Rod Last OV Oct 2019  Checks fasting cbgs occ 120-150s Denies any sx hypoglycemia Denies any polydipsia or polyuria Takes metformin 565m BID Takes simvastatin 294mdaily Denies any myalgias Does not exercise or follow diet regularly Last eye exam in 2018, [facility] Denies vision concerns, uses readers Denies any BLE paresthesias, she does have chronic RLE paresthesia, much improved  Used to take iron when she had heavy periods Has not needed to take for years   Lab Results  Component Value Date   HGBA1C 6.6 (H) 06/01/2018   Lab Results  Component Value Date   CHOL 181 06/01/2018   HDL 51 06/01/2018   LDLCALC 97 06/01/2018   TRIG 163 (H) 06/01/2018   CHOLHDL 3.5 06/01/2018   Lab Results  Component Value Date   CREATININE 0.70 06/01/2018   BUN 11 06/01/2018   NA 143 06/01/2018   K 4.5 06/01/2018   CL 103 06/01/2018   CO2 23 06/01/2018    Depression screen PHQ 2/9 05/27/2019 06/01/2018 04/07/2017  Decreased Interest 0 0 0  Down, Depressed, Hopeless 0 0 0  PHQ - 2 Score 0 0 0    Fall Risk  05/27/2019 06/01/2018 04/07/2017 01/08/2016  Falls in the past year? 0 No No No  Number falls in past yr: 0 - - -  Injury with Fall? 0 - - -  Follow up Falls evaluation completed - - -     No Known Allergies  Prior to Admission medications   Medication Sig Start Date End Date Taking? Authorizing Provider  aspirin EC 81 MG tablet Take 81 mg by mouth daily.   Yes [provider]  Blood Glucose Monitoring Suppl (ONETOUCH VERIO) w/Device KIT 1 kit by Does not apply route as directed. 10/12/17  Yes  Betancourt, TiAura FeyNP  glucose blood (ONETOUCH VERIO) test strip Use as instructed 10/12/17  Yes Betancourt, Tina A, NP  IRON, FERROUS SULFATE, PO Take 1 tablet by mouth as needed.   Yes [provider]  metFORMIN (GLUCOPHAGE) 500 MG tablet Take 1 tablet (500 mg total) by mouth 2 (two) times daily with a meal. 06/01/18  Yes Stallings, Zoe A, MD  simvastatin (ZOCOR) 20 MG tablet Take 1 tablet (20 mg total) by mouth every other day. 10/15/18  Yes StForrest MoronMD    Past Medical History:  Diagnosis Date  . Diabetes mellitus without complication ([place]  . Hyperlipidemia     Past Surgical History:  Procedure Laterality Date  . HERNIA REPAIR    . SPINE SURGERY  Disk surgery    Social History   Tobacco Use  . Smoking status: Never Smoker  . Smokeless tobacco: Never Used  Substance Use Topics  . Alcohol use: No    Alcohol/week: 0.0 standard drinks    Family History  Problem Relation Age of Onset  . Diabetes Father   . Breast cancer Neg Hx   . Colon cancer Neg Hx     Review of Systems  Constitutional: Negative for chills and fever.  Respiratory: Negative for cough and shortness of breath.  Cardiovascular: Negative for chest pain, palpitations and leg swelling.  Gastrointestinal: Negative for abdominal pain, nausea and vomiting.  per hpi   OBJECTIVE:  Today's Vitals   05/27/19 0841  BP: 120/73  Pulse: 74  Temp: 98.2 F (36.8 C)  TempSrc: Oral  SpO2: 98%  Weight: 163 lb (73.9 kg)   Body mass index is 27.98 kg/m.  Wt Readings from Last 3 Encounters:  05/27/19 163 lb (73.9 kg)  06/01/18 165 lb (74.8 kg)  04/30/17 160 lb (72.6 kg)    Physical Exam Vitals signs and nursing note reviewed.  Constitutional:      Appearance: She is well-developed.  HENT:     Head: Normocephalic and atraumatic.     Mouth/Throat:     Pharynx: No oropharyngeal exudate.  Eyes:     General: No scleral icterus.    Conjunctiva/sclera: Conjunctivae normal.     Pupils:  Pupils are equal, round, and reactive to light.  Neck:     Musculoskeletal: Neck supple.  Cardiovascular:     Rate and Rhythm: Normal rate and regular rhythm.     Heart sounds: Normal heart sounds. No murmur. No friction rub. No gallop.   Pulmonary:     Effort: Pulmonary effort is normal.     Breath sounds: Normal breath sounds. No wheezing or rales.  Musculoskeletal:     Right lower leg: No edema.     Left lower leg: No edema.  Skin:    General: Skin is warm and dry.  Neurological:     Mental Status: She is alert and oriented to person, place, and time.     Diabetic Foot Form - Detailed   Diabetic Foot Exam - detailed Can the patient see the bottom of their feet?: No Are the shoes appropriate in style and fit?: No Is there swelling or and abnormal foot shape?: No Is there a claw toe deformity?: No Is there elevated skin temparature?: No Is there foot or ankle muscle weakness?: No Normal Range of Motion: No Semmes-Weinstein Monofilament Test R Site 1-Great Toe: Neg L Site 1-Great Toe: Neg         ASSESSMENT and PLAN  1. Need for prophylactic vaccination and inoculation against influenza - Flu Vaccine QUAD 6+ mos PF IM (Fluarix Quad PF)  2. Controlled type 2 diabetes mellitus without complication, without long-term current use of insulin ([place]) Per last A1c controlled. Labs pending, meds to be adjusted as needed. HCM addressed. - Hemoglobin A1c - Microalbumin/Creatinine Ratio, Urine - HM DIABETES FOOT EXAM - Ambulatory referral to Ophthalmology - metFORMIN (GLUCOPHAGE) 500 MG tablet; Take 1 tablet (500 mg total) by mouth 2 (two) times daily with a meal.  3. Pure hypercholesterolemia Per last labs at goal. Checking labs today, medications will be adjusted as needed.  - Lipid panel - Comprehensive metabolic panel  Other orders - simvastatin (ZOCOR) 20 MG tablet; Take 1 tablet (20 mg total) by mouth daily.  Return in about 6 months (around 11/25/2019) for Dr  Nolon Rod, DM2 and HLP.    Rutherford Guys, MD Primary [facility] at [facility] [place], [place] [address] Ph.  [phone] Fax [phone]

## 2019-05-28 LAB — HEMOGLOBIN A1C
Est. average glucose Bld gHb Est-mCnc: 128 mg/dL
Hgb A1c MFr Bld: 6.1 % — ABNORMAL HIGH (ref 4.8–5.6)

## 2019-05-28 LAB — COMPREHENSIVE METABOLIC PANEL
ALT: 18 IU/L (ref 0–32)
AST: 21 IU/L (ref 0–40)
Albumin/Globulin Ratio: 2 (ref 1.2–2.2)
Albumin: 4.3 g/dL (ref 3.8–4.9)
Alkaline Phosphatase: 51 IU/L (ref 39–117)
BUN/Creatinine Ratio: 23 (ref 9–23)
BUN: 14 mg/dL (ref 6–24)
Bilirubin Total: 0.5 mg/dL (ref 0.0–1.2)
CO2: 25 mmol/L (ref 20–29)
Calcium: 9.3 mg/dL (ref 8.7–10.2)
Chloride: 103 mmol/L (ref 96–106)
Creatinine, Ser: 0.6 mg/dL (ref 0.57–1.00)
GFR calc Af Amer: 119 mL/min/{1.73_m2} (ref >59)
GFR calc non Af Amer: 103 mL/min/{1.73_m2} (ref >59)
Globulin, Total: 2.1 g/dL (ref 1.5–4.5)
Glucose: 115 mg/dL — ABNORMAL HIGH (ref 65–99)
Potassium: 4.3 mmol/L (ref 3.5–5.2)
Sodium: 138 mmol/L (ref 134–144)
Total Protein: 6.4 g/dL (ref 6.0–8.5)

## 2019-05-28 LAB — MICROALBUMIN / CREATININE URINE RATIO
Creatinine, Urine: 140.2 mg/dL
Microalb/Creat Ratio: 9 mg/g creat (ref 0–29)
Microalbumin, Urine: 12.5 ug/mL

## 2019-05-28 LAB — LIPID PANEL
Chol/HDL Ratio: 3.4 ratio (ref 0.0–4.4)
Cholesterol, Total: 180 mg/dL (ref 100–199)
HDL: 53 mg/dL (ref >39)
LDL Chol Calc (NIH): 103 mg/dL — ABNORMAL HIGH (ref 0–99)
Triglycerides: 136 mg/dL (ref 0–149)
VLDL Cholesterol Cal: 24 mg/dL (ref 5–40)

## 2019-07-01 ENCOUNTER — Ambulatory Visit: Payer: Self-pay | Admitting: Registered Nurse

## 2019-07-01 NOTE — Progress Notes (Deleted)
53y/o [place] established female pt c/o R dental and jaw pain since Friday 11/20. Pt had a filling placed on Friday at dental office. Has had increasing pain since then. R lower molar received filling. RN noted upon exam yesterday that there was white spots around tooth that could be related to work on Friday. Also noted ulcer on R lateral aspect of tongue. No swelling/edema noted in mouth or at jawline. No hot potato voice noted. Pt's voice is raspy at baseline. No drainage at tooth or tongue ulcer. Pain extends along R mandible to in front of R ear. No OTC treatment before seeing RN yesterday. RN gave pt oragel packets to apply to gums that are painful and Tylenol 650mg  po q4-6h from clinic stock. Advised to start warm saltwater gargles last night as well, at least TID. Pt agreeable to this and scheduled today's appt with NP.   Subjective:    Patient ID: [name], female    DOB: [birth year], 53 y.o.   MRN: [id number]  HPI    Review of Systems     Objective:   Physical Exam        Assessment & Plan:

## 2019-07-04 ENCOUNTER — Encounter: Payer: Self-pay | Admitting: Adult Health Nurse Practitioner

## 2019-07-04 ENCOUNTER — Ambulatory Visit: Payer: PRIVATE HEALTH INSURANCE | Admitting: Adult Health Nurse Practitioner

## 2019-07-04 ENCOUNTER — Other Ambulatory Visit: Payer: Self-pay

## 2019-07-04 VITALS — BP 140/90 | HR 85 | Temp 98.2°F | Ht 64.0 in | Wt 156.0 lb

## 2019-07-04 DIAGNOSIS — K0889 Other specified disorders of teeth and supporting structures: Secondary | ICD-10-CM | POA: Diagnosis not present

## 2019-07-04 DIAGNOSIS — B370 Candidal stomatitis: Secondary | ICD-10-CM | POA: Diagnosis not present

## 2019-07-04 DIAGNOSIS — K047 Periapical abscess without sinus: Secondary | ICD-10-CM | POA: Diagnosis not present

## 2019-07-04 HISTORY — DX: Periapical abscess without sinus: K04.7

## 2019-07-04 HISTORY — DX: Candidal stomatitis: B37.0

## 2019-07-04 MED ORDER — NYSTATIN 100000 UNIT/ML MT SUSP: 5.0000 mL | Freq: Four times a day (QID) | OROMUCOSAL | 0 refills | Status: DC

## 2019-07-04 MED ORDER — HYDROCODONE-ACETAMINOPHEN 7.5-325 MG/15ML PO SOLN: 10.0000 mL | Freq: Four times a day (QID) | ORAL | 0 refills | Status: AC | PRN

## 2019-07-04 MED ORDER — FLUCONAZOLE 150 MG PO TABS: 150.0000 mg | ORAL_TABLET | Freq: Every day | ORAL | 0 refills | Status: AC

## 2019-07-04 NOTE — Progress Notes (Signed)
.   Acute Office Visit  Subjective:    Patient ID: [name], female    DOB: September 19, 1965, 53 y.o.   MRN: [id number]  Chief Complaint  Patient presents with  . Dental Pain    went to dentst on Friday, having oral and tongue pain. taking prednisone and amoxicillin    HPI Patient is in today for a tooth infection, difficulty swallowing and pain to her tongue.  Pain is associated with white spots and a yellowish-white coating of tongue.  Hurts to open jaw.  Hurts to swallow.  Denies fever, chills, body aches.    Past Medical History:  Diagnosis Date  . Diabetes mellitus without complication ([place])   . Hyperlipidemia   . Thrush, oral 07/04/2019  . Tooth infection 07/04/2019    Past Surgical History:  Procedure Laterality Date  . HERNIA REPAIR    . SPINE SURGERY  Disk surgery    Family History  Problem Relation Age of Onset  . Diabetes Father   . Breast cancer Neg Hx   . Colon cancer Neg Hx       No Known Allergies  Review of Systems  Constitutional: Negative for chills and fever.  HENT:       + for left sided jaw pain, tongue pain   Eyes: Negative.        Objective:    Physical Exam  Constitutional: She appears well-developed and well-nourished.  HENT:  Head: Normocephalic and atraumatic.  Mouth/Throat: Abnormal dentition.  Thick yellow exudate covering tongue throughout.   Eyes: Pupils are equal, round, and reactive to light. EOM are normal.  Neck: Normal range of motion. No tracheal deviation present.  Lymphadenopathy:    She has cervical adenopathy.  Nursing note and vitals reviewed.   BP 140/90   Pulse 85   Temp 98.2 F (36.8 C)   Ht 5\' 4"  (1.626 m)   Wt 156 lb (70.8 kg)   SpO2 96%   BMI 26.78 kg/m  Wt Readings from Last 3 Encounters:  07/04/19 156 lb (70.8 kg)  05/27/19 163 lb (73.9 kg)  06/01/18 165 lb (74.8 kg)   Lab Results  Component Value Date   TRIG 136 05/27/2019   Lab Results  Component Value Date   CHOLHDL 3.4 05/27/2019   Lab Results  Component Value Date   HGBA1C 6.1 (H) 05/27/2019       Assessment & Plan:   Problem List Items Addressed This Visit    Tooth infection (Chronic)   Relevant Medications   fluconazole (DIFLUCAN) 150 MG tablet   nystatin (MYCOSTATIN) 100000 UNIT/ML suspension   Thrush, oral (Chronic)   Relevant Medications   fluconazole (DIFLUCAN) 150 MG tablet   nystatin (MYCOSTATIN) 100000 UNIT/ML suspension    Other Visit Diagnoses    Pain, dental    -  Primary   Relevant Orders   Ambulatory referral to Dentistry       Meds ordered this encounter  Medications  . fluconazole (DIFLUCAN) 150 MG tablet    Sig: Take 1 tablet (150 mg total) by mouth daily for 2 doses.    Dispense:  2 tablet    Refill:  0  . nystatin (MYCOSTATIN) 100000 UNIT/ML suspension    Sig: Take 5 mLs (500,000 Units total) by mouth 4 (four) times daily.    Dispense:  60 mL    Refill:  0  . HYDROcodone-acetaminophen (HYCET) 7.5-325 mg/15 ml solution    Sig: Take 10 mLs by mouth 4 (four) times  daily as needed for up to 5 days for moderate pain.    Dispense:  120 mL    Refill:  0   Have asked the patient to follow-up with her dentist regarding the tooth pain and right-sided jaw pain.  Did not change her antibiotic as I think most of her symptoms are due to the thrush at this point.  She is in line with this plan  Glyn Ade, NP

## 2019-07-04 NOTE — Patient Instructions (Signed)
° ° ° °  If you have lab work done today you will be contacted with your lab results within the next 2 weeks.  If you have not heard from us then please contact us. The fastest way to get your results is to register for My Chart. ° ° °IF you received an x-ray today, you will receive an invoice from [facility]. Please contact [facility] at [phone] with questions or concerns regarding your invoice.  ° °IF you received labwork today, you will receive an invoice from [facility]. Please contact [facility] at [phone] with questions or concerns regarding your invoice.  ° °Our billing staff will not be able to assist you with questions regarding bills from these companies. ° °You will be contacted with the lab results as soon as they are available. The fastest way to get your results is to activate your My Chart account. Instructions are located on the last page of this paperwork. If you have not heard from us regarding the results in 2 weeks, please contact this office. °  ° ° ° °

## 2020-04-09 ENCOUNTER — Ambulatory Visit: Payer: PRIVATE HEALTH INSURANCE | Admitting: Family Medicine

## 2020-04-09 ENCOUNTER — Telehealth (INDEPENDENT_AMBULATORY_CARE_PROVIDER_SITE_OTHER): Payer: PRIVATE HEALTH INSURANCE

## 2020-04-09 NOTE — Telephone Encounter (Signed)
Attempted to call pt no answer. We need more information to assist.

## 2020-04-09 NOTE — Telephone Encounter (Signed)
Pt called requesting refills on medications up until their appt. With Mr. Maximiano Coss in October. Pt. Was unable to identify which medications

## 2020-04-13 NOTE — Telephone Encounter (Signed)
Patient needs new Rx for metformin 500mg  po BID and simvastatin 20mg  po daily.  Last fill 12/11/2019 per paper chart review [facility] formulary.  No refills remaining on two Rx from PCM dated 05/27/2019.  Bridge refill given to patient today as no meds remaining.  Patient reported next [facility] appt 04 Jun 2020.  Patient verbalized understanding information/instructions, agreed with plan of care and had no further questions at this time.  Needs [facility] Rx from [facility] for metformin and simvastatin.  Last chemistry labs per Epic normal renal and liver function Hgba1c 6.1 05/27/2019

## 2020-06-04 ENCOUNTER — Other Ambulatory Visit: Payer: Self-pay

## 2020-06-04 ENCOUNTER — Ambulatory Visit (INDEPENDENT_AMBULATORY_CARE_PROVIDER_SITE_OTHER): Payer: PRIVATE HEALTH INSURANCE | Admitting: Registered Nurse

## 2020-06-04 ENCOUNTER — Encounter: Payer: Self-pay | Admitting: Registered Nurse

## 2020-06-04 VITALS — BP 160/100 | HR 99 | Temp 97.9°F | Resp 18 | Ht 64.0 in | Wt 155.8 lb

## 2020-06-04 DIAGNOSIS — Z0000 Encounter for general adult medical examination without abnormal findings: Secondary | ICD-10-CM

## 2020-06-04 DIAGNOSIS — Z23 Encounter for immunization: Secondary | ICD-10-CM

## 2020-06-04 DIAGNOSIS — E119 Type 2 diabetes mellitus without complications: Secondary | ICD-10-CM | POA: Diagnosis not present

## 2020-06-04 DIAGNOSIS — Z13228 Encounter for screening for other metabolic disorders: Secondary | ICD-10-CM

## 2020-06-04 DIAGNOSIS — Z0001 Encounter for general adult medical examination with abnormal findings: Secondary | ICD-10-CM

## 2020-06-04 DIAGNOSIS — Z13220 Encounter for screening for lipoid disorders: Secondary | ICD-10-CM

## 2020-06-04 DIAGNOSIS — M5441 Lumbago with sciatica, right side: Secondary | ICD-10-CM

## 2020-06-04 DIAGNOSIS — Z1329 Encounter for screening for other suspected endocrine disorder: Secondary | ICD-10-CM

## 2020-06-04 DIAGNOSIS — G8929 Other chronic pain: Secondary | ICD-10-CM

## 2020-06-04 DIAGNOSIS — Z5320 Procedure and treatment not carried out because of patient's decision for unspecified reasons: Secondary | ICD-10-CM

## 2020-06-04 DIAGNOSIS — Z130 Encounter for screening for diseases of the blood and blood-forming organs and certain disorders involving the immune mechanism: Secondary | ICD-10-CM

## 2020-06-04 DIAGNOSIS — Z7689 Persons encountering health services in other specified circumstances: Secondary | ICD-10-CM

## 2020-06-04 LAB — POCT GLYCOSYLATED HEMOGLOBIN (HGB A1C): Hemoglobin A1C: 7.6 % — AB (ref 4.0–5.6)

## 2020-06-04 LAB — LIPID PANEL

## 2020-06-04 MED ORDER — METFORMIN HCL 500 MG PO TABS: 500.0000 mg | ORAL_TABLET | Freq: Two times a day (BID) | ORAL | 1 refills | Status: DC

## 2020-06-04 MED ORDER — DICLOFENAC SODIUM 75 MG PO TBEC: 75.0000 mg | DELAYED_RELEASE_TABLET | Freq: Two times a day (BID) | ORAL | 0 refills | Status: DC

## 2020-06-04 MED ORDER — SIMVASTATIN 20 MG PO TABS: 20.0000 mg | ORAL_TABLET | Freq: Every day | ORAL | 1 refills | Status: DC

## 2020-06-04 NOTE — Progress Notes (Signed)
Established Patient Office Visit  Subjective:  Patient ID: [name], female    DOB: February 27, 1966  Age: 54 y.o. MRN: [id number]  CC:  Chief Complaint  Patient presents with  . Transitions Of Care    patient states she is here for a TOC patient states she would also like an CPE.    HPI [name] presents for TOC and CPE  Hx of T2DM: metformin 515m PO bid. Has run out. Notes that overall she has a good diet and has lost around 10lb in the past 1-2 years. Feeling well. No notes of any side effects of t2dm, no evidence of end organ damage to her knowledge.   Otherwise notes that she has some back pain from a lumbar spinal surgery that occurred around 10 years ago. Notes that she will sometimes have flairs of pain and use OTCs, but this time she is getting some sciatica and waking up at night with some pain. Sciatica is all right sided. Denies saddle anesthesia and all other symptoms. Has used diclofenac in the past with good effect.  Notes that she is due for follow up colonoscopy. Had one 3 years ago that found polyps warranting this follow up. Will send referral Pap last year. wnl according to patient. Next due 2025.   Past Medical History:  Diagnosis Date  . Diabetes mellitus without complication ([facility]   . Hyperlipidemia   . Thrush, oral 07/04/2019  . Tooth infection 07/04/2019    Past Surgical History:  Procedure Laterality Date  . HERNIA REPAIR    . SPINE SURGERY  Disk surgery    Family History  Problem Relation Age of Onset  . Diabetes Father   . Breast cancer Neg Hx   . Colon cancer Neg Hx     Social History   Socioeconomic History  . Marital status: Married    Spouse name: Not on file  . Number of children: Not on file  . Years of education: Not on file  . Highest education level: Not on file  Occupational History  . Not on file  Tobacco Use  . Smoking status: Never Smoker  . Smokeless tobacco: Never Used  Vaping Use  . Vaping Use: Never used    Substance and Sexual Activity  . Alcohol use: No    Alcohol/week: 0.0 standard drinks  . Drug use: No  . Sexual activity: Not on file  Other Topics Concern  . Not on file  Social History Narrative  . Not on file   Social Determinants of Health   Financial Resource Strain:   . Difficulty of Paying Living Expenses: Not on file  Food Insecurity:   . Worried About RCharity fundraiserin the Last Year: Not on file  . Ran Out of Food in the Last Year: Not on file  Transportation Needs:   . Lack of Transportation (Medical): Not on file  . Lack of Transportation (Non-Medical): Not on file  Physical Activity:   . Days of Exercise per Week: Not on file  . Minutes of Exercise per Session: Not on file  Stress:   . Feeling of Stress : Not on file  Social Connections:   . Frequency of Communication with Friends and Family: Not on file  . Frequency of Social Gatherings with Friends and Family: Not on file  . Attends Religious Services: Not on file  . Active Member of Clubs or Organizations: Not on file  . Attends CArchivistMeetings: Not on  file  . Marital Status: Not on file  Intimate Partner Violence:   . Fear of Current or Ex-Partner: Not on file  . Emotionally Abused: Not on file  . Physically Abused: Not on file  . Sexually Abused: Not on file    Outpatient Medications Prior to Visit  Medication Sig Dispense Refill  . aspirin EC 81 MG tablet Take 81 mg by mouth daily.    . Blood Glucose Monitoring Suppl (ONETOUCH VERIO) w/Device KIT 1 kit by Does not apply route as directed. 1 kit 0  . glucose blood (ONETOUCH VERIO) test strip Use as instructed 100 each 12  . amoxicillin (AMOXIL) 500 MG capsule Take 500 mg by mouth 4 (four) times daily.    . metFORMIN (GLUCOPHAGE) 500 MG tablet Take 1 tablet (500 mg total) by mouth 2 (two) times daily with a meal. 180 tablet 1  . methylPREDNISolone (MEDROL DOSEPAK) 4 MG TBPK tablet TAKE AS DIRECTED PER PACKAGE DIRECTIONS    . nystatin  (MYCOSTATIN) 100000 UNIT/ML suspension Take 5 mLs (500,000 Units total) by mouth 4 (four) times daily. 60 mL 0  . simvastatin (ZOCOR) 20 MG tablet Take 1 tablet (20 mg total) by mouth daily. 90 tablet 1   No facility-administered medications prior to visit.    No Known Allergies  ROS Review of Systems  Constitutional: Negative.   HENT: Negative.   Eyes: Negative.   Respiratory: Negative.   Cardiovascular: Negative.   Gastrointestinal: Negative.   Genitourinary: Negative.   Musculoskeletal: Negative.   Skin: Negative.   Neurological: Negative.   Psychiatric/Behavioral: Negative.       Objective:    Physical Exam Vitals and nursing note reviewed.  Constitutional:      General: She is not in acute distress.    Appearance: Normal appearance. She is normal weight. She is not ill-appearing, toxic-appearing or diaphoretic.  HENT:     Head: Normocephalic and atraumatic.     Right Ear: Tympanic membrane, ear canal and external ear normal. There is no impacted cerumen.     Left Ear: Tympanic membrane, ear canal and external ear normal. There is no impacted cerumen.     Nose: Nose normal. No congestion or rhinorrhea.     Mouth/Throat:     Mouth: Mucous membranes are moist.     Pharynx: Oropharynx is clear. No oropharyngeal exudate or posterior oropharyngeal erythema.  Eyes:     General: No scleral icterus.       Right eye: No discharge.        Left eye: No discharge.     Extraocular Movements: Extraocular movements intact.     Conjunctiva/sclera: Conjunctivae normal.     Pupils: Pupils are equal, round, and reactive to light.  Cardiovascular:     Rate and Rhythm: Normal rate and regular rhythm.     Pulses: Normal pulses.     Heart sounds: Normal heart sounds. No murmur heard.  No friction rub. No gallop.   Pulmonary:     Effort: Pulmonary effort is normal. No respiratory distress.     Breath sounds: Normal breath sounds. No stridor. No wheezing, rhonchi or rales.  Chest:      Chest wall: No tenderness.  Abdominal:     General: Abdomen is flat. Bowel sounds are normal. There is no distension.     Palpations: Abdomen is soft. There is no mass.     Tenderness: There is no abdominal tenderness. There is no right CVA tenderness, left CVA tenderness, guarding or  rebound.     Hernia: No hernia is present.  Musculoskeletal:        General: No swelling, tenderness, deformity or signs of injury. Normal range of motion.     Right lower leg: No edema.     Left lower leg: No edema.  Skin:    General: Skin is warm and dry.     Capillary Refill: Capillary refill takes less than 2 seconds.     Coloration: Skin is not jaundiced or pale.     Findings: No bruising, erythema, lesion or rash.  Neurological:     General: No focal deficit present.     Mental Status: She is alert and oriented to person, place, and time. Mental status is at baseline.     Cranial Nerves: No cranial nerve deficit.     Sensory: No sensory deficit.     Motor: No weakness.     Coordination: Coordination normal.     Gait: Gait normal.     Deep Tendon Reflexes: Reflexes normal.  Psychiatric:        Mood and Affect: Mood normal.        Behavior: Behavior normal.        Thought Content: Thought content normal.        Judgment: Judgment normal.     BP (!) 160/100   Pulse 99   Temp 97.9 F (36.6 C) (Temporal)   Resp 18   Ht 5' 4" (1.626 m)   Wt 155 lb 12.8 oz (70.7 kg)   SpO2 94%   BMI 26.74 kg/m  Wt Readings from Last 3 Encounters:  06/04/20 155 lb 12.8 oz (70.7 kg)  07/04/19 156 lb (70.8 kg)  05/27/19 163 lb (73.9 kg)     Health Maintenance Due  Topic Date Due  . COLONOSCOPY  04/30/2020  . PAP SMEAR-Modifier  04/07/2020  . FOOT EXAM  05/26/2020  . URINE MICROALBUMIN  05/26/2020    There are no preventive care reminders to display for this patient.  Lab Results  Component Value Date   TSH 1.290 12/28/2017   Lab Results  Component Value Date   WBC 7.1 12/28/2017   HGB 12.2  12/28/2017   HCT 37.6 12/28/2017   MCV 89 12/28/2017   PLT 322 12/28/2017   Lab Results  Component Value Date   NA 138 05/27/2019   K 4.3 05/27/2019   CO2 25 05/27/2019   GLUCOSE 115 (H) 05/27/2019   BUN 14 05/27/2019   CREATININE 0.60 05/27/2019   BILITOT 0.5 05/27/2019   ALKPHOS 51 05/27/2019   AST 21 05/27/2019   ALT 18 05/27/2019   PROT 6.4 05/27/2019   ALBUMIN 4.3 05/27/2019   CALCIUM 9.3 05/27/2019   Lab Results  Component Value Date   CHOL 180 05/27/2019   Lab Results  Component Value Date   HDL 53 05/27/2019   Lab Results  Component Value Date   LDLCALC 103 (H) 05/27/2019   Lab Results  Component Value Date   TRIG 136 05/27/2019   Lab Results  Component Value Date   CHOLHDL 3.4 05/27/2019   Lab Results  Component Value Date   HGBA1C 7.6 (A) 06/04/2020      Assessment & Plan:   Problem List Items Addressed This Visit      Endocrine   Controlled diabetes mellitus type II without complication (HCC) - Primary   Relevant Medications   metFORMIN (GLUCOPHAGE) 500 MG tablet   simvastatin (ZOCOR) 20 MG tablet   Other  Relevant Orders   POCT glycosylated hemoglobin (Hb A1C) (Completed)   Lipid panel   Microalbumin / creatinine urine ratio    Other Visit Diagnoses    Annual physical exam       Encounter to establish care       Flu vaccine need       Relevant Orders   Flu Vaccine QUAD 6+ mos PF IM (Fluarix Quad PF) (Completed)   Screening for endocrine, metabolic and immunity disorder       Relevant Orders   Ambulatory referral to Gastroenterology   Comprehensive metabolic panel   CBC With Differential   TSH   Lipid screening       Relevant Orders   Lipid panel   Papanicolaou smear declined       Relevant Orders   Ambulatory referral to Gynecology   Chronic bilateral low back pain with right-sided sciatica       Relevant Medications   diclofenac (VOLTAREN) 75 MG EC tablet      Meds ordered this encounter  Medications  . diclofenac  (VOLTAREN) 75 MG EC tablet    Sig: Take 1 tablet (75 mg total) by mouth 2 (two) times daily.    Dispense:  30 tablet    Refill:  0    Order Specific Question:   Supervising Provider    Answer:   Carlota Raspberry, JEFFREY R [[id number]]  . metFORMIN (GLUCOPHAGE) 500 MG tablet    Sig: Take 1 tablet (500 mg total) by mouth 2 (two) times daily with a meal.    Dispense:  180 tablet    Refill:  1    Order Specific Question:   Supervising Provider    Answer:   Carlota Raspberry, JEFFREY R [[id number]]  . simvastatin (ZOCOR) 20 MG tablet    Sig: Take 1 tablet (20 mg total) by mouth daily.    Dispense:  90 tablet    Refill:  1    Order Specific Question:   Supervising Provider    Answer:   Carlota Raspberry, JEFFREY R [[id number]]    Follow-up: No follow-ups on file.   PLAN  Refill metformin. A1c today up to 7.6 from 6.1 around 1 year ago - discussed importance of lifestyle modifications and med compliance with patient  Refill simvastatin. Lipids sent today.  Diclofenac 49m PO bid PRN for back pain. Suggest stretching.  Pt asking for resources for mental health for a friend of hers, insists she is in good mental health but wants to know where to direct others. [facility] outpatient mental health number given.  Exam unremarkable  Labs collected, will follow up as warranted  Patient encouraged to call clinic with any questions, comments, or concerns.  RMaximiano Coss NP

## 2020-06-04 NOTE — Patient Instructions (Addendum)
[facility] [place] [facility] [place] [place],    [id number]    If you have lab work done today you will be contacted with your lab results within the next 2 weeks.  If you have not heard from [place] then please contact us. The fastest way to get your results is to register for My Chart.   IF you received an x-ray today, you will receive an invoice from [facility]. Please contact [facility] at [phone] with questions or concerns regarding your invoice.   IF you received labwork today, you will receive an invoice from [facility]. Please contact [facility] at [phone] with questions or concerns regarding your invoice.   Our billing staff will not be able to assist you with questions regarding bills from these companies.  You will be contacted with the lab results as soon as they are available. The fastest way to get your results is to activate your My Chart account. Instructions are located on the last page of this paperwork. If you have not heard from [place] regarding the results in 2 weeks, please contact this office.

## 2020-06-05 ENCOUNTER — Other Ambulatory Visit: Payer: Self-pay | Admitting: Registered Nurse

## 2020-06-05 DIAGNOSIS — E119 Type 2 diabetes mellitus without complications: Secondary | ICD-10-CM

## 2020-06-05 LAB — LIPID PANEL
Chol/HDL Ratio: 4 ratio (ref 0.0–4.4)
Cholesterol, Total: 200 mg/dL — ABNORMAL HIGH (ref 100–199)
HDL: 50 mg/dL (ref >39)
LDL Chol Calc (NIH): 113 mg/dL — ABNORMAL HIGH (ref 0–99)
Triglycerides: 216 mg/dL — ABNORMAL HIGH (ref 0–149)
VLDL Cholesterol Cal: 37 mg/dL (ref 5–40)

## 2020-06-05 LAB — CBC WITH DIFFERENTIAL
Basophils Absolute: 0.1 10*3/uL (ref 0.0–0.2)
Basos: 1 %
EOS (ABSOLUTE): 0.2 10*3/uL (ref 0.0–0.4)
Eos: 3 %
Hematocrit: 42.7 % (ref 34.0–46.6)
Hemoglobin: 14 g/dL (ref 11.1–15.9)
Immature Grans (Abs): 0 10*3/uL (ref 0.0–0.1)
Immature Granulocytes: 0 %
Lymphocytes Absolute: 1.6 10*3/uL (ref 0.7–3.1)
Lymphs: 30 %
MCH: 28.6 pg (ref 26.6–33.0)
MCHC: 32.8 g/dL (ref 31.5–35.7)
MCV: 87 fL (ref 79–97)
Monocytes Absolute: 0.5 10*3/uL (ref 0.1–0.9)
Monocytes: 9 %
Neutrophils Absolute: 3 10*3/uL (ref 1.4–7.0)
Neutrophils: 57 %
RBC: 4.89 x10E6/uL (ref 3.77–5.28)
RDW: 13.3 % (ref 11.7–15.4)
WBC: 5.3 10*3/uL (ref 3.4–10.8)

## 2020-06-05 LAB — TSH: TSH: 0.815 u[IU]/mL (ref 0.450–4.500)

## 2020-06-05 LAB — MICROALBUMIN / CREATININE URINE RATIO
Creatinine, Urine: 93.2 mg/dL
Microalb/Creat Ratio: 6 mg/g creat (ref 0–29)
Microalbumin, Urine: 5.2 ug/mL

## 2020-06-05 LAB — COMPREHENSIVE METABOLIC PANEL
ALT: 16 IU/L (ref 0–32)
AST: 12 IU/L (ref 0–40)
Albumin/Globulin Ratio: 1.9 (ref 1.2–2.2)
Albumin: 4.5 g/dL (ref 3.8–4.9)
Alkaline Phosphatase: 69 IU/L (ref 44–121)
BUN/Creatinine Ratio: 16 (ref 9–23)
BUN: 10 mg/dL (ref 6–24)
Bilirubin Total: 0.7 mg/dL (ref 0.0–1.2)
CO2: 23 mmol/L (ref 20–29)
Calcium: 9.6 mg/dL (ref 8.7–10.2)
Chloride: 100 mmol/L (ref 96–106)
Creatinine, Ser: 0.64 mg/dL (ref 0.57–1.00)
GFR calc Af Amer: 115 mL/min/{1.73_m2} (ref >59)
GFR calc non Af Amer: 100 mL/min/{1.73_m2} (ref >59)
Globulin, Total: 2.4 g/dL (ref 1.5–4.5)
Glucose: 161 mg/dL — ABNORMAL HIGH (ref 65–99)
Potassium: 4.6 mmol/L (ref 3.5–5.2)
Sodium: 137 mmol/L (ref 134–144)
Total Protein: 6.9 g/dL (ref 6.0–8.5)

## 2020-06-05 MED ORDER — SIMVASTATIN 20 MG PO TABS: 20.0000 mg | ORAL_TABLET | Freq: Every day | ORAL | 1 refills | Status: DC

## 2020-06-05 MED ORDER — METFORMIN HCL 500 MG PO TABS: 500.0000 mg | ORAL_TABLET | Freq: Two times a day (BID) | ORAL | 1 refills | Status: DC

## 2020-06-15 ENCOUNTER — Encounter: Payer: Self-pay | Admitting: Registered Nurse

## 2020-06-15 ENCOUNTER — Ambulatory Visit: Payer: Self-pay | Admitting: Registered Nurse

## 2020-06-15 ENCOUNTER — Other Ambulatory Visit: Payer: Self-pay

## 2020-06-15 VITALS — BP 136/89 | HR 101 | Temp 99.0°F

## 2020-06-15 DIAGNOSIS — R81 Glycosuria: Secondary | ICD-10-CM

## 2020-06-15 DIAGNOSIS — R300 Dysuria: Secondary | ICD-10-CM

## 2020-06-15 LAB — POCT URINALYSIS DIPSTICK
Bilirubin, UA: NEGATIVE
Blood, UA: NEGATIVE
Glucose, UA: 250 mg/dL — AB
Ketones, POC UA: NEGATIVE mg/dL
Leukocytes, UA: NEGATIVE
Nitrite, UA: NEGATIVE
Protein, UA: NEGATIVE
Spec Grav, UA: 1.01 (ref 1.010–1.025)
Urobilinogen, UA: 0.2 E.U./dL
pH, UA: 6 (ref 5.0–8.0)

## 2020-06-15 MED ORDER — PHENAZOPYRIDINE HCL 200 MG PO TABS: 200.0000 mg | ORAL_TABLET | Freq: Three times a day (TID) | ORAL | 0 refills | Status: DC

## 2020-06-15 NOTE — Patient Instructions (Signed)
Hyperglycemia Hyperglycemia occurs when the level of sugar (glucose) in the blood is too high. Glucose is a type of sugar that provides the body's main source of energy. Certain hormones (insulin and glucagon) control the level of glucose in the blood. Insulin lowers blood glucose, and glucagon increases blood glucose. Hyperglycemia can result from having too little insulin in the bloodstream, or from the body not responding normally to insulin. Hyperglycemia occurs most often in people who have diabetes (diabetes mellitus), but it can happen in people who do not have diabetes. It can develop quickly, and it can be life-threatening if it causes you to become severely dehydrated (diabetic ketoacidosis or hyperglycemic hyperosmolar state). Severe hyperglycemia is a medical emergency. What are the causes? If you have diabetes, hyperglycemia may be caused by: Diabetes medicine. Medicines that increase blood glucose or affect your diabetes control. Not eating enough, or not eating often enough. Changes in physical activity level. Being sick or having an infection. If you have prediabetes or undiagnosed diabetes: Hyperglycemia may be caused by those conditions. If you do not have diabetes, hyperglycemia may be caused by: Certain medicines, including steroid medicines, beta-blockers, epinephrine, and thiazide diuretics. Stress. Serious illness. Surgery. Diseases of the pancreas. Infection. What increases the risk? Hyperglycemia is more likely to develop in people who have risk factors for diabetes, such as: Having a family member with diabetes. Having a gene for type 1 diabetes that is passed from parent to child (inherited). Living in an area with cold weather conditions. Exposure to certain viruses. Certain conditions in which the body's disease-fighting (immune) system attacks itself (autoimmune disorders). Being overweight or obese. Having an inactive (sedentary) lifestyle. Having been  diagnosed with insulin resistance. Having a history of prediabetes, gestational diabetes, or polycystic ovarian syndrome (PCOS). Being of American-Indian, African-American, Hispanic/Latino, or Asian/Pacific Islander descent. What are the signs or symptoms? Hyperglycemia may not cause any symptoms. If you do have symptoms, they may include early warning signs, such as: Increased thirst. Hunger. Feeling very tired. Needing to urinate more often than usual. Blurry vision. Other symptoms may develop if hyperglycemia gets worse, such as: Dry mouth. Loss of appetite. Fruity-smelling breath. Weakness. Unexpected or rapid weight gain or weight loss. Tingling or numbness in the hands or feet. Headache. Skin that does not quickly return to normal after being lightly pinched and released (poor skin turgor). Abdominal pain. Cuts or bruises that are slow to heal. How is this diagnosed? Hyperglycemia is diagnosed with a blood test to measure your blood glucose level. This blood test is usually done while you are having symptoms. Your health care provider may also do a physical exam and review your medical history. You may have more tests to determine the cause of your hyperglycemia, such as: A fasting blood glucose (FBG) test. You will not be allowed to eat (you will fast) for at least 8 hours before a blood sample is taken. An A1c (hemoglobin A1c) blood test. This provides information about blood glucose control over the previous 2-3 months. An oral glucose tolerance test (OGTT). This measures your blood glucose at two times: After fasting. This is your baseline blood glucose level. Two hours after drinking a beverage that contains glucose. How is this treated? Treatment depends on the cause of your hyperglycemia. Treatment may include: Taking medicine to regulate your blood glucose levels. If you take insulin or other diabetes medicines, your medicine or dosage may be adjusted. Lifestyle changes,  such as exercising more, eating healthier foods, or losing weight.  Treating an illness or infection, if this caused your hyperglycemia. Checking your blood glucose more often. Stopping or reducing steroid medicines, if these caused your hyperglycemia. If your hyperglycemia becomes severe and it results in hyperglycemic hyperosmolar state, you must be hospitalized and given IV fluids. Follow these instructions at home:  General instructions Take over-the-counter and prescription medicines only as told by your health care provider. Do not use any products that contain nicotine or tobacco, such as cigarettes and e-cigarettes. If you need help quitting, ask your health care provider. Limit alcohol intake to no more than 1 drink per day for nonpregnant women and 2 drinks per day for men. One drink equals 12 oz of beer, 5 oz of wine, or 1 oz of hard liquor. Learn to manage stress. If you need help with this, ask your health care provider. Keep all follow-up visits as told by your health care provider. This is important. Eating and drinking  Maintain a healthy weight. Exercise regularly, as directed by your health care provider. Stay hydrated, especially when you exercise, get sick, or spend time in hot temperatures. Eat healthy foods, such as: Lean proteins. Complex carbohydrates. Fresh fruits and vegetables. Low-fat dairy products. Healthy fats. Drink enough fluid to keep your urine clear or pale yellow. If you have diabetes: Make sure you know the symptoms of hyperglycemia. Follow your diabetes management plan, as told by your health care provider. Make sure you: Take your insulin and medicines as directed. Follow your exercise plan. Follow your meal plan. Eat on time, and do not skip meals. Check your blood glucose as often as directed. Make sure to check your blood glucose before and after exercise. If you exercise longer or in a different way than usual, check your blood glucose more  often. Follow your sick day plan whenever you cannot eat or drink normally. Make this plan in advance with your health care provider. Share your diabetes management plan with people in your workplace, school, and household. Check your urine for ketones when you are ill and as told by your health care provider. Carry a medical alert card or wear medical alert jewelry. Contact a health care provider if: Your blood glucose is at or above 240 mg/dL (13.3 mmol/L) for 2 days in a row. You have problems keeping your blood glucose in your target range. You have frequent episodes of hyperglycemia. Get help right away if: You have difficulty breathing. You have a change in how you think, feel, or act (mental status). You have nausea or vomiting that does not go away. These symptoms may represent a serious problem that is an emergency. Do not wait to see if the symptoms will go away. Get medical help right away. Call your local emergency services (911 in the [place] Do not drive yourself to the hospital. Summary Hyperglycemia occurs when the level of sugar (glucose) in the blood is too high. Hyperglycemia is diagnosed with a blood test to measure your blood glucose level. This blood test is usually done while you are having symptoms. Your health care provider may also do a physical exam and review your medical history. If you have diabetes, follow your diabetes management plan as told by your health care provider. Contact your health care provider if you have problems keeping your blood glucose in your target range. This information is not intended to replace advice given to you by your health care provider. Make sure you discuss any questions you have with your health care provider. Document  Revised: 04/10/2016 Document Reviewed: 04/10/2016 Elsevier Patient Education  [place]. Urine Glucose Test Why am I having this test? The urine glucose test measures how much sugar (glucose) is in your  urine. Normally, the kidneys filter glucose out of the blood and then the glucose is reabsorbed into the bloodstream to be used by the body for energy. If there is too much glucose in the blood, some glucose is not reabsorbed and instead leaves the body through urine. You may have a urine glucose test:  As part of a routine urine test. The urine glucose test is a part of standard urine testing.  To check if you have diabetes (diabetes mellitus).  To check for kidney problems.  To check for a urinary tract infection (UTI). What kind of sample is taken?  A urine sample is required for this test. You may be asked to provide just one urine sample (random specimen), or you may be asked to collect urine samples at home over a period of 24 hours (24-hour specimen). How do I collect samples at home? When collecting a urine sample at home, make sure you:  Use supplies and instructions that you received from the lab.  Collect urine only in the germ-free (sterile) cup that you received from the lab.  Do not let any toilet paper or stool (feces) get into the cup.  Refrigerate the sample until you can return it to the lab.  Return the sample(s) to the lab as instructed. Tell a health care provider about:  All medicines you are taking, including vitamins, herbs, eye drops, creams, and over-the-counter medicines.  Any medical conditions you have.  Whether you are pregnant or may be pregnant. How are the results reported?  Random specimen test results will be reported as positive or negative for glucose.  24-hour specimen test results will be reported as a value that indicates how much glucose is in your urine. Your health care provider will compare your results to normal ranges that were established after testing a large group of people (reference ranges). Reference ranges may vary among labs and hospitals. For the 24-hour urine glucose test, a common reference range is 50-300 mg/day or  0.3-1.7 mmol/day (SI units). What do the results mean? If you had the random specimen test:  A negative result means that you have no glucose in your urine or a very small amount of glucose in your urine. This is normal.  A positive result means that you have too much glucose in your urine. If you had the 24-hour specimen test:  A result within the reference range is considered normal.  A result above the reference range means that you have too much glucose in your urine. If you have too much glucose in your urine, this may mean that you have:  Diabetes.  A temporary form of diabetes that develops during pregnancy (gestational diabetes).  Kidney disease.  Genetic defects in how the kidneys filter blood.  Severe stress.  Recent injury. Talk with your health care provider about what your results mean. Questions to ask your health care provider Ask your health care provider, or the department that is doing the test:  When will my results be ready?  How will I get my results?  What are my treatment options?  What other tests do I need?  What are my next steps? Summary  The urine glucose test measures how much sugar (glucose) is in your urine.  Normally, the kidneys filter glucose out  of the blood and then the glucose is reabsorbed into the bloodstream to be used by the body for energy. If there is too much glucose in the blood, some glucose is not reabsorbed and instead leaves the body through urine.  A positive result or a result that is higher than the reference range means that you have too much glucose in your urine. Talk with your health care provider about what your results mean. This information is not intended to replace advice given to you by your health care provider. Make sure you discuss any questions you have with your health care provider. Document Revised: 07/06/2017 Document Reviewed: 04/03/2017 Elsevier Patient Education  [place]. Dysuria Dysuria is pain or discomfort while urinating. The pain or discomfort may be felt in the part of your body that drains urine from the bladder (urethra) or in the surrounding tissue of the genitals. The pain may also be felt in the groin area, lower abdomen, or lower back. You may have to urinate frequently or have the sudden feeling that you have to urinate (urgency). Dysuria can affect both men and women, but it is more common in women. Dysuria can be caused by many different things, including:  Urinary tract infection.  Kidney stones or bladder stones.  Certain sexually transmitted infections (STIs), such as chlamydia.  Dehydration.  Inflammation of the tissues of the vagina.  Use of certain medicines.  Use of certain soaps or scented products that cause irritation. Follow these instructions at home: General instructions  Watch your condition for any changes.  Urinate often. Avoid holding urine for long periods of time.  After a bowel movement or urination, women should cleanse from front to back, using each tissue only once.  Urinate after sexual intercourse.  Keep all follow-up visits as told by your health care provider. This is important.  If you had any tests done to find the cause of dysuria, it is up to you to get your test results. Ask your health care provider, or the department that is doing the test, when your results will be ready. Eating and drinking   Drink enough fluid to keep your urine pale yellow.  Avoid caffeine, tea, and alcohol. They can irritate the bladder and make dysuria worse. In men, alcohol may irritate the prostate. Medicines  Take over-the-counter and prescription medicines only as told by your health care provider.  If you were prescribed an antibiotic medicine, take it as told by your health care provider. Do not stop taking the antibiotic even if you start to feel better. Contact a health care provider if:  You have a  fever.  You develop pain in your back or sides.  You have nausea or vomiting.  You have blood in your urine.  You are not urinating as often as you usually do. Get help right away if:  Your pain is severe and not relieved with medicines.  You cannot eat or drink without vomiting.  You are confused.  You have a rapid heartbeat while at rest.  You have shaking or chills.  You feel extremely weak. Summary  Dysuria is pain or discomfort while urinating. Many different conditions can lead to dysuria.  If you have dysuria, you may have to urinate frequently or have the sudden feeling that you have to urinate (urgency).  Watch your condition for any changes. Keep all follow-up visits as told by your health care provider.  Make sure that you urinate often and drink enough  fluid to keep your urine pale yellow. This information is not intended to replace advice given to you by your health care provider. Make sure you discuss any questions you have with your health care provider. Document Revised: 07/06/2017 Document Reviewed: 05/10/2017 Elsevier Patient Education  [place].

## 2020-06-15 NOTE — Progress Notes (Signed)
Subjective:    Patient ID: [name], female    DOB: Dec 01, 1965, 54 y.o.   MRN: [id number]  54y/o established female pt c/o UTI sx since yesterday. C/o urinary frequency, suprapubic pressure and pain.   Last UTI 2019 bactrim cleared it.  Patient asking for new Rx today.  PMHx Type II diabetes  Patient has had coffee and eaten today.  She is taking her metformin.  Last Hgba1c elevated compared to her normal 6s at 7.12 May 2020 renal function normal  Denied n/v/d/headache/hand/feet swelling or back pain.     Review of Systems  Constitutional: Negative for activity change, appetite change, chills, diaphoresis, fatigue and fever.  HENT: Negative for trouble swallowing and voice change.   Eyes: Negative for photophobia and visual disturbance.  Respiratory: Negative for cough, shortness of breath, wheezing and stridor.   Cardiovascular: Negative for chest pain and leg swelling.  Gastrointestinal: Positive for abdominal pain. Negative for blood in stool, constipation, diarrhea, nausea and vomiting.  Endocrine: Negative for cold intolerance and heat intolerance.  Genitourinary: Positive for dysuria and frequency. Negative for difficulty urinating, hematuria and menstrual problem.  Musculoskeletal: Negative for back pain, gait problem, myalgias, neck pain and neck stiffness.  Skin: Negative for rash.  Allergic/Immunologic: Positive for environmental allergies and immunocompromised state. Negative for food allergies.  Neurological: Negative for dizziness, tremors, syncope, weakness, light-headedness and headaches.  Hematological: Negative for adenopathy. Does not bruise/bleed easily.  Psychiatric/Behavioral: Negative for agitation, confusion and sleep disturbance.       Objective:   Physical Exam Vitals and nursing note reviewed.  Constitutional:      General: She is awake. She is not in acute distress.    Appearance: Normal appearance. She is well-developed, well-groomed and normal weight.  She is not ill-appearing, toxic-appearing or diaphoretic.  HENT:     Head: Normocephalic and atraumatic.     Jaw: There is normal jaw occlusion.     Right Ear: Hearing and external ear normal.     Left Ear: Hearing and external ear normal.     Nose: Nose normal.     Mouth/Throat:     Pharynx: Oropharynx is clear.  Eyes:     General: Lids are normal. Vision grossly intact. Gaze aligned appropriately. Allergic shiner present. No visual field deficit or scleral icterus.       Right eye: No discharge.        Left eye: No discharge.     Extraocular Movements: Extraocular movements intact.     Conjunctiva/sclera: Conjunctivae normal.     Pupils: Pupils are equal, round, and reactive to light.  Neck:     Trachea: Trachea and phonation normal. No abnormal tracheal secretions or tracheal deviation.  Cardiovascular:     Rate and Rhythm: Regular rhythm. Tachycardia present.     Pulses: Normal pulses.          Radial pulses are 2+ on the right side and 2+ on the left side.  Pulmonary:     Effort: Pulmonary effort is normal. No respiratory distress.     Breath sounds: Normal breath sounds and air entry. No stridor or transmitted upper airway sounds. No wheezing.     Comments: Wearing cloth mask due to covid 19 pandemic; spoke full sentences without difficulty; no cough in clinic noted Abdominal:     General: Abdomen is flat. There is no distension.     Palpations: Abdomen is soft.     Tenderness: There is no right CVA tenderness, left CVA  tenderness or guarding.     Comments: Soft abdomen negative CVA tenderness bilaterally  Musculoskeletal:        General: No swelling, tenderness, deformity or signs of injury. Normal range of motion.     Right shoulder: No swelling, deformity, effusion or laceration.     Left shoulder: No swelling, deformity, effusion or laceration.     Right elbow: No swelling, deformity, effusion or lacerations.     Left elbow: No swelling, deformity, effusion or  lacerations.     Right hand: No swelling, deformity or lacerations. Normal range of motion.     Left hand: No swelling, deformity or lacerations. Normal range of motion.     Cervical back: Normal range of motion and neck supple. No swelling, edema, deformity, erythema, signs of trauma, lacerations, rigidity, torticollis or crepitus. No pain with movement. Normal range of motion.     Thoracic back: No swelling, edema, deformity, signs of trauma or lacerations.     Lumbar back: No swelling, edema, deformity, signs of trauma or lacerations.     Right lower leg: No edema.     Left lower leg: No edema.  Lymphadenopathy:     Head:     Right side of head: No submental or preauricular adenopathy.     Left side of head: No submental or preauricular adenopathy.     Cervical: No cervical adenopathy.     Right cervical: No superficial cervical adenopathy.    Left cervical: No superficial cervical adenopathy.  Skin:    General: Skin is warm and dry.     Capillary Refill: Capillary refill takes less than 2 seconds.     Coloration: Skin is not ashen, cyanotic, jaundiced, mottled, pale or sallow.     Findings: No abrasion, abscess, acne, bruising, burn, ecchymosis, erythema, signs of injury, laceration, lesion, petechiae, rash or wound.     Nails: There is no clubbing.  Neurological:     General: No focal deficit present.     Mental Status: She is alert and oriented to person, place, and time. Mental status is at baseline.     GCS: GCS eye subscore is 4. GCS verbal subscore is 5. GCS motor subscore is 6.     Cranial Nerves: Cranial nerves are intact. No cranial nerve deficit, dysarthria or facial asymmetry.     Sensory: Sensation is intact. No sensory deficit.     Motor: Motor function is intact. No weakness, tremor, atrophy, abnormal muscle tone or seizure activity.     Coordination: Coordination is intact. Coordination normal.     Gait: Gait is intact. Gait normal.     Comments: In/out of chair  without difficulty; bilateral hand grasp equal 5/5; gait sure and steady in clinic  Psychiatric:        Attention and Perception: Attention and perception normal.        Mood and Affect: Mood and affect normal.        Speech: Speech normal.        Behavior: Behavior normal. Behavior is cooperative.        Thought Content: Thought content normal.        Cognition and Memory: Cognition and memory normal.        Judgment: Judgment normal.           Assessment & Plan:  A-glucosuria, dysuria  P-Pyridium push water urine culture sent typically results back in 72 hours.  Urinalysis with glucose 250 otherwise normal clear light yellow in cup no  sediment  last Hgba1c 7.12 May 2020 Discussed avoid concentrated added sugars in diet the next few days--keep added sugars under 25 grams/5 teaspoons  Discussed glucose spilling in urine can be irritating to bladder along with caffeine and some foods.   Medications as directed. Patient is also to push noncaffeinated low sugar fluids and may use Pyridium 200mg  po TID as needed x 2 days #6 RF0 sent to her pharmacy of choice.  Discussed pyridium will make urine gold/orange and it could stain clothes/surfaces.  Will help with bladder spasms may also purchase otc as Azo if prefers OTC/cheaper.   Hydrate, avoid dehydration. Avoid holding urine void on frequent basis every 4 to 6 hours. If unable to void every 8 hours follow up for re-evaluation with PCM, urgent care or ER. Call or return to clinic as needed if these symptoms worsen or fail to improve as anticipated. Exitcare handout on urine glucose test/hyperglycemia/dysuria    Patient verbalized agreement and understanding of treatment plan and had no further questions at this time.  P2: Hydrate and cranberry juice

## 2020-06-17 LAB — URINE CULTURE: Organism ID, Bacteria: NO GROWTH

## 2020-06-18 NOTE — Progress Notes (Signed)
Noted had discussed elevated blood sugar/Hgba1c with patient at office visit also.  Renal/liver function, electrolytes, CBC normal and lipids worsening which is typically seen if blood sugar worsening also.  Follow up with PCM

## 2020-06-18 NOTE — Progress Notes (Signed)
Spoke with pt in clinic. Advised of no growth, clean urine. Push fluids for clear to pale yellow urine and voiding q2-4h, Metformin, and avoid added sugars. Also reviewed 10/29 lab results from [place] as she reports not receiving these from [facility] office yet. Reviewed and provided a hard copy.

## 2020-06-22 ENCOUNTER — Ambulatory Visit: Payer: Self-pay | Admitting: Registered Nurse

## 2020-07-07 ENCOUNTER — Telehealth: Payer: Self-pay | Admitting: Registered Nurse

## 2020-07-07 NOTE — Telephone Encounter (Signed)
Referral Followup °

## 2020-07-15 ENCOUNTER — Telehealth: Payer: Self-pay | Admitting: Registered Nurse

## 2020-07-15 ENCOUNTER — Other Ambulatory Visit: Payer: Self-pay

## 2020-07-15 ENCOUNTER — Ambulatory Visit: Payer: Self-pay | Admitting: *Deleted

## 2020-07-15 DIAGNOSIS — M5441 Lumbago with sciatica, right side: Secondary | ICD-10-CM

## 2020-07-15 DIAGNOSIS — M5430 Sciatica, unspecified side: Secondary | ICD-10-CM

## 2020-07-15 DIAGNOSIS — G8929 Other chronic pain: Secondary | ICD-10-CM

## 2020-07-15 MED ORDER — DICLOFENAC SODIUM 75 MG PO TBEC: 75.0000 mg | DELAYED_RELEASE_TABLET | Freq: Two times a day (BID) | ORAL | 0 refills | Status: AC | PRN

## 2020-07-15 NOTE — Progress Notes (Signed)
Fasting labs per NP Otila Kluver verbal orders.

## 2020-07-15 NOTE — Telephone Encounter (Signed)
Patient seen at urgent care for sciatica and back pain.  Started on diclofenac DR 75mg  po BID #30 RF0 and patient reported has been working well for her.  Discussed we do carry this medication on formulary [facility] [facility] [employer].  Since it can affect kidney function/liver function will need nonfasting labs today CMET.  Avoid taking other NSAIDS e.g. motrin/ibuprofen/advil/aleve/naproxen/naprosyn while taking diclofenac.  Avoid dehydration.  Encouraged taking with food to help prevent gastritis.  Patient verbalized understanding information/instructions, agreed with plan of care and will follow up with RN Hildred Alamin for lab draw nonfasting after hydrating today.

## 2020-07-16 LAB — COMPREHENSIVE METABOLIC PANEL
ALT: 24 IU/L (ref 0–32)
AST: 20 IU/L (ref 0–40)
Albumin/Globulin Ratio: 2.1 (ref 1.2–2.2)
Albumin: 4.7 g/dL (ref 3.8–4.9)
Alkaline Phosphatase: 62 IU/L (ref 44–121)
BUN/Creatinine Ratio: 18 (ref 9–23)
BUN: 14 mg/dL (ref 6–24)
Bilirubin Total: 0.4 mg/dL (ref 0.0–1.2)
CO2: 25 mmol/L (ref 20–29)
Calcium: 9.7 mg/dL (ref 8.7–10.2)
Chloride: 102 mmol/L (ref 96–106)
Creatinine, Ser: 0.8 mg/dL (ref 0.57–1.00)
GFR calc Af Amer: 95 mL/min/{1.73_m2} (ref >59)
GFR calc non Af Amer: 83 mL/min/{1.73_m2} (ref >59)
Globulin, Total: 2.2 g/dL (ref 1.5–4.5)
Glucose: 124 mg/dL — ABNORMAL HIGH (ref 65–99)
Potassium: 4.4 mmol/L (ref 3.5–5.2)
Sodium: 142 mmol/L (ref 134–144)
Total Protein: 6.9 g/dL (ref 6.0–8.5)

## 2020-07-16 NOTE — Telephone Encounter (Signed)
CMET completed yesterday. WNL except glucose though pt was nonfasting at time of draw.

## 2020-07-18 NOTE — Telephone Encounter (Signed)
Reviewed CMET nonfasting normal nonfasting elevated glucose after lunch draw; normal liver/kidney function

## 2020-07-23 NOTE — Progress Notes (Signed)
Results reviewed with pt in clinic. She has not viewed Mychart mesg. Hard copy of labs provided to pt. No further questions/concerns.

## 2020-07-24 NOTE — Progress Notes (Signed)
Noted results reviewed with patient by RN Hildred Alamin no further questions

## 2020-09-27 ENCOUNTER — Encounter: Payer: Self-pay | Admitting: Gastroenterology

## 2020-10-12 ENCOUNTER — Encounter: Payer: Self-pay | Admitting: Registered Nurse

## 2020-10-12 ENCOUNTER — Telehealth: Payer: Self-pay | Admitting: Registered Nurse

## 2020-10-12 DIAGNOSIS — E7849 Other hyperlipidemia: Secondary | ICD-10-CM

## 2020-10-12 DIAGNOSIS — E119 Type 2 diabetes mellitus without complications: Secondary | ICD-10-CM

## 2020-10-12 DIAGNOSIS — G8929 Other chronic pain: Secondary | ICD-10-CM

## 2020-10-12 NOTE — Telephone Encounter (Signed)
Patient requested refill simvastatin 20mg  po daily #90, metformin 500mg  po BID with meals #180 and diclofenac DR 75mg  po BID prn pain #30 last filled 07/15/2020 and new Rx was received at last [facility] visit Oct 2021 each RF1.  Labs Dec 2021.  Patient notified PCM office closing and will need to choose new PCM by end of the month by [facility] office.  Discussed with patient to contact office to discuss refills with PCM.  90 day bridge refill given today from [facility] [place] as labs stable Dec 2021.  Jun 04 2020 Hgba1c 7.6   Patient verbalized understanding information/instructions, agreed with plan of care and had no further questions at this time. Results for [name], [name] (MRN [id number]) as of 10/12/2020 09:13  Ref. Range 07/15/2020 12:31  Sodium Latest Ref Range: 134 - 144 mmol/L 142  Potassium Latest Ref Range: 3.5 - 5.2 mmol/L 4.4  Chloride Latest Ref Range: 96 - 106 mmol/L 102  CO2 Latest Ref Range: 20 - 29 mmol/L 25  Glucose Latest Ref Range: 65 - 99 mg/dL 124 (H)  BUN Latest Ref Range: 6 - 24 mg/dL 14  Creatinine Latest Ref Range: 0.57 - 1.00 mg/dL 0.80  Calcium Latest Ref Range: 8.7 - 10.2 mg/dL 9.7  BUN/Creatinine Ratio Latest Ref Range: 9 - 23  18  Alkaline Phosphatase Latest Ref Range: 44 - 121 IU/L 62  Albumin Latest Ref Range: 3.8 - 4.9 g/dL 4.7  Albumin/Globulin Ratio Latest Ref Range: 1.2 - 2.2  2.1  AST Latest Ref Range: 0 - 40 IU/L 20  ALT Latest Ref Range: 0 - 32 IU/L 24  Total Protein Latest Ref Range: 6.0 - 8.5 g/dL 6.9  Total Bilirubin Latest Ref Range: 0.0 - 1.2 mg/dL 0.4  GFR, Est Non African American Latest Ref Range: >59 mL/min/1.73 83  GFR, Est African American Latest Ref Range: >59 mL/min/1.73 95  Globulin, Total Latest Ref Range: 1.5 - 4.5 g/dL 2.2

## 2020-11-18 ENCOUNTER — Other Ambulatory Visit: Payer: Self-pay

## 2020-11-18 ENCOUNTER — Ambulatory Visit (AMBULATORY_SURGERY_CENTER): Payer: Self-pay

## 2020-11-18 VITALS — Ht 64.0 in | Wt 158.0 lb

## 2020-11-18 DIAGNOSIS — Z86010 Personal history of colonic polyps: Secondary | ICD-10-CM

## 2020-11-18 MED ORDER — SUTAB 1479-225-188 MG PO TABS: 12.0000 | ORAL_TABLET | ORAL | 0 refills | Status: DC

## 2020-11-18 NOTE — Progress Notes (Signed)
No egg or soy allergy known to patient  No issues with past sedation with any surgeries or procedures Patient denies ever being told they had issues or difficulty with intubation  No FH of Malignant Hyperthermia No diet pills per patient No home 02 use per patient  No blood thinners per patient  Pt denies issues with constipation  No A fib or A flutter  EMMI video to pt or via [place] 19 guidelines implemented in [place] today with Pt and RN  Pt is fully vaccinated  for [employer] given to pt in PV today , Code to Pharmacy and  NO PA's for preps discussed with pt In PV today  Discussed with pt there will be an out-of-pocket cost for prep and that varies from $0 to 70 dollars   Due to the COVID-19 pandemic we are asking patients to follow certain guidelines.  Pt aware of COVID protocols and [facility] guidelines

## 2020-12-03 ENCOUNTER — Encounter: Payer: Self-pay | Admitting: Gastroenterology

## 2020-12-03 ENCOUNTER — Ambulatory Visit (AMBULATORY_SURGERY_CENTER): Payer: No Typology Code available for payment source | Admitting: Gastroenterology

## 2020-12-03 ENCOUNTER — Other Ambulatory Visit: Payer: Self-pay

## 2020-12-03 VITALS — BP 127/71 | HR 67 | Temp 96.9°F | Resp 10 | Ht 64.0 in | Wt 158.0 lb

## 2020-12-03 DIAGNOSIS — D120 Benign neoplasm of cecum: Secondary | ICD-10-CM

## 2020-12-03 DIAGNOSIS — D124 Benign neoplasm of descending colon: Secondary | ICD-10-CM

## 2020-12-03 DIAGNOSIS — Z86010 Personal history of colonic polyps: Secondary | ICD-10-CM | POA: Diagnosis not present

## 2020-12-03 DIAGNOSIS — K635 Polyp of colon: Secondary | ICD-10-CM | POA: Diagnosis not present

## 2020-12-03 MED ORDER — SODIUM CHLORIDE 0.9 % IV SOLN: 500.0000 mL | Freq: Once | INTRAVENOUS | Status: DC

## 2020-12-03 NOTE — Patient Instructions (Signed)
Handouts given for polyps and hemorrhoids.  Await pathology results.  YOU HAD AN ENDOSCOPIC PROCEDURE TODAY AT [facility]:   Refer to the procedure report that was given to you for any specific questions about what was found during the examination.  If the procedure report does not answer your questions, please call your gastroenterologist to clarify.  If you requested that your care partner not be given the details of your procedure findings, then the procedure report has been included in a sealed envelope for you to review at your convenience later.  YOU SHOULD EXPECT: Some feelings of bloating in the abdomen. Passage of more gas than usual.  Walking can help get rid of the air that was put into your GI tract during the procedure and reduce the bloating. If you had a lower endoscopy (such as a colonoscopy or flexible sigmoidoscopy) you may notice spotting of blood in your stool or on the toilet paper. If you underwent a bowel prep for your procedure, you may not have a normal bowel movement for a few days.  Please Note:  You might notice some irritation and congestion in your nose or some drainage.  This is from the oxygen used during your procedure.  There is no need for concern and it should clear up in a day or so.  SYMPTOMS TO REPORT IMMEDIATELY:   Following lower endoscopy (colonoscopy or flexible sigmoidoscopy):  Excessive amounts of blood in the stool  Significant tenderness or worsening of abdominal pains  Swelling of the abdomen that is new, acute  Fever of 100F or higher  For urgent or emergent issues, a gastroenterologist can be reached at any hour by calling [phone]. Do not use MyChart messaging for urgent concerns.    DIET:  We do recommend a small meal at first, but then you may proceed to your regular diet.  Drink plenty of fluids but you should avoid alcoholic beverages for 24 hours.  ACTIVITY:  You should plan to take it easy for the rest of today  and you should NOT DRIVE or use heavy machinery until tomorrow (because of the sedation medicines used during the test).    FOLLOW UP: Our staff will call the number listed on your records 48-72 hours following your procedure to check on you and address any questions or concerns that you may have regarding the information given to you following your procedure. If we do not reach you, we will leave a message.  We will attempt to reach you two times.  During this call, we will ask if you have developed any symptoms of COVID 19. If you develop any symptoms (ie: fever, flu-like symptoms, shortness of breath, cough etc.) before then, please call [phone].  If you test positive for Covid 19 in the 2 weeks post procedure, please call and report this information to [place].    If any biopsies were taken you will be contacted by phone or by letter within the next 1-3 weeks.  Please call us at [phone] if you have not heard about the biopsies in 3 weeks.    SIGNATURES/CONFIDENTIALITY: You and/or your care partner have signed paperwork which will be entered into your electronic medical record.  These signatures attest to the fact that that the information above on your After Visit Summary has been reviewed and is understood.  Full responsibility of the confidentiality of this discharge information lies with you and/or your care-partner.

## 2020-12-03 NOTE — Op Note (Signed)
[place] Patient Name: [name] Procedure Date: 12/03/2020 11:52 AM MRN: [id number] Endoscopist: Mauri Pole , MD Age: 55 Referring MD:  Date of Birth: [birth year] Gender: Female Account #: [id number] Procedure:                Colonoscopy Indications:              High risk colon cancer surveillance: Personal                            history of colonic polyps, High risk colon cancer                            surveillance: Personal history of adenoma (10 mm or                            greater in size) Medicines:                Monitored Anesthesia Care Procedure:                Pre-Anesthesia Assessment:                           - Prior to the procedure, a History and Physical                            was performed, and patient medications and                            allergies were reviewed. The patient's tolerance of                            previous anesthesia was also reviewed. The risks                            and benefits of the procedure and the sedation                            options and risks were discussed with the patient.                            All questions were answered, and informed consent                            was obtained. Prior Anticoagulants: The patient has                            taken no previous anticoagulant or antiplatelet                            agents. ASA Grade Assessment: II - A patient with                            mild systemic disease. After reviewing the risks  and benefits, the patient was deemed in                            satisfactory condition to undergo the procedure.                           After obtaining informed consent, the colonoscope                            was passed under direct vision. Throughout the                            procedure, the patient's blood pressure, pulse, and                            oxygen saturations were monitored  continuously. The                            Olympus PCF-H190DL [id number]) Colonoscope was                            introduced through the anus and advanced to the the                            cecum, identified by appendiceal orifice and                            ileocecal valve. The colonoscopy was performed                            without difficulty. The patient tolerated the                            procedure well. The quality of the bowel                            preparation was excellent. The ileocecal valve,                            appendiceal orifice, and rectum were photographed. Scope In: 11:56:25 AM Scope Out: 12:14:14 PM Scope Withdrawal Time: 0 hours 8 minutes 42 seconds  Total Procedure Duration: 0 hours 17 minutes 49 seconds  Findings:                 The perianal and digital rectal examinations were                            normal.                           Two sessile polyps were found in the ascending                            colon and cecum. The polyps were 9 to 11 mm in  size. These polyps were removed with a cold snare.                            Resection and retrieval were complete.                           Non-bleeding external and internal hemorrhoids were                            found during retroflexion. The hemorrhoids were                            medium-sized.                           Anal papilla(e) were hypertrophied. Complications:            No immediate complications. Estimated Blood Loss:     Estimated blood loss was minimal. Impression:               - Two 9 to 11 mm polyps in the ascending colon and                            in the cecum, removed with a cold snare. Resected                            and retrieved.                           - Non-bleeding external and internal hemorrhoids.                           - Anal papilla(e) were hypertrophied. Recommendation:           - Patient has a  contact number available for                            emergencies. The signs and symptoms of potential                            delayed complications were discussed with the                            patient. Return to normal activities tomorrow.                            Written discharge instructions were provided to the                            patient.                           - Resume previous diet.                           - Continue present medications.                           -  Await pathology results.                           - Repeat colonoscopy in 3 years for surveillance                            based on pathology results. Mauri Pole, MD 12/03/2020 12:19:27 PM This report has been signed electronically.

## 2020-12-03 NOTE — Progress Notes (Signed)
PT taken to PACU. Monitors in place. VSS. Report given to RN. 

## 2020-12-03 NOTE — Progress Notes (Signed)
Called to room to assist during endoscopic procedure.  Patient ID and intended procedure confirmed with present staff. Received instructions for my participation in the procedure from the performing physician.  

## 2020-12-03 NOTE — Progress Notes (Signed)
Medical history reviewed. VS assessed by A.G 

## 2020-12-07 ENCOUNTER — Telehealth: Payer: Self-pay

## 2020-12-07 ENCOUNTER — Telehealth: Payer: Self-pay | Admitting: *Deleted

## 2020-12-07 NOTE — Telephone Encounter (Signed)
Called [phone] and left a message we tried to reach pt for a follow up call. maw

## 2020-12-07 NOTE — Telephone Encounter (Signed)
Left message on f/u call 

## 2020-12-24 ENCOUNTER — Encounter: Payer: Self-pay | Admitting: Gastroenterology

## 2021-01-20 ENCOUNTER — Encounter: Payer: Self-pay | Admitting: Registered Nurse

## 2021-01-20 ENCOUNTER — Telehealth: Payer: Self-pay | Admitting: Registered Nurse

## 2021-01-20 DIAGNOSIS — E119 Type 2 diabetes mellitus without complications: Secondary | ICD-10-CM

## 2021-01-20 MED ORDER — DICLOFENAC SODIUM 75 MG PO TBEC: 75.0000 mg | DELAYED_RELEASE_TABLET | Freq: Two times a day (BID) | ORAL | 0 refills | Status: AC | PRN

## 2021-01-20 MED ORDER — SIMVASTATIN 20 MG PO TABS: 20.0000 mg | ORAL_TABLET | Freq: Every day | ORAL | 0 refills | Status: DC

## 2021-01-20 MED ORDER — METFORMIN HCL 500 MG PO TABS
500.0000 mg | ORAL_TABLET | Freq: Two times a day (BID) | ORAL | 0 refills | Status: DC
Start: 2021-01-20 — End: 2021-04-28

## 2021-01-20 NOTE — Telephone Encounter (Signed)
Patient seen at Urgent Care but they do not chart in Epic unable to see notes for new Rx.  Provider on vacation and urgent care stated cannot send Rx to [place].  Patient will run out of medication tomorrow.  Bridge refill metformin 571m po BID with meal #180 RF0, simvastatin 230mpo daily #90 RF0 and diclofenac DR 7577mo BID prn pain.  Lab results pulled from [facility] system and reviewed from 01/16/2021 glucose 136 Cr 0.64 BUN 10 eGFR 103 AST 17 ALT 14 WBC 6.3 RBC 4.5 Hgb 12.6 Hct 39.3 Platelets 291 total cholesterol 187 triglycerides 177 HDL 50 LDL 106  Hgba1c 7.2 TSH 1.040.   Previous Hgba1c Oct 2021 7.6 glucose 161  [facility]  Last [facility] fill 10/12/2020 90 day supply.  Patient to bring in Rx when available from her provider for next fill.  Patient verbalized understanding information/instructions, agreed with plan of care and had no further questions at this time.  Results for PARHAEDYN, BREAURN [id number] of 01/20/2021 12:44  Ref. Range 06/04/2020 09:55  Total CHOL/HDL Ratio Latest Ref Range: 0.0 - 4.4 ratio 4.0  Cholesterol, Total Latest Ref Range: 100 - 199 mg/dL 200 (H)  HDL Cholesterol Latest Ref Range: >39 mg/dL 50  Triglycerides Latest Ref Range: 0 - 149 mg/dL 216 (H)  VLDL Cholesterol Cal Latest Ref Range: 5 - 40 mg/dL 37  LDL Chol Calc (NIH) Latest Ref Range: 0 - 99 mg/dL 113 (H)

## 2021-02-15 ENCOUNTER — Other Ambulatory Visit: Payer: Self-pay

## 2021-02-15 ENCOUNTER — Ambulatory Visit: Payer: Self-pay | Admitting: *Deleted

## 2021-02-15 VITALS — BP 132/79 | HR 72 | Ht 64.5 in | Wt 157.0 lb

## 2021-02-15 DIAGNOSIS — E119 Type 2 diabetes mellitus without complications: Secondary | ICD-10-CM

## 2021-02-15 DIAGNOSIS — Z0000 Encounter for general adult medical examination without abnormal findings: Secondary | ICD-10-CM

## 2021-02-15 NOTE — Progress Notes (Signed)
[employer] insurance premium discount evaluation: Labs Drawn. Replacements ROI form signed. Tobacco Free Attestation form signed.  Forms placed in paper chart.  

## 2021-02-16 ENCOUNTER — Encounter: Payer: Self-pay | Admitting: Registered Nurse

## 2021-02-16 LAB — CMP12+LP+TP+TSH+6AC+CBC/D/PLT
ALT: 12 IU/L (ref 0–32)
AST: 15 IU/L (ref 0–40)
Albumin/Globulin Ratio: 2.4 — ABNORMAL HIGH (ref 1.2–2.2)
Albumin: 4.7 g/dL (ref 3.8–4.9)
Alkaline Phosphatase: 72 IU/L (ref 44–121)
BUN/Creatinine Ratio: 23 (ref 9–23)
BUN: 16 mg/dL (ref 6–24)
Basophils Absolute: 0.1 10*3/uL (ref 0.0–0.2)
Basos: 1 %
Bilirubin Total: 0.4 mg/dL (ref 0.0–1.2)
Calcium: 9.2 mg/dL (ref 8.7–10.2)
Chloride: 103 mmol/L (ref 96–106)
Chol/HDL Ratio: 3.7 ratio (ref 0.0–4.4)
Cholesterol, Total: 175 mg/dL (ref 100–199)
Creatinine, Ser: 0.7 mg/dL (ref 0.57–1.00)
EOS (ABSOLUTE): 0.2 10*3/uL (ref 0.0–0.4)
Eos: 3 %
Estimated CHD Risk: 0.7 times avg. (ref 0.0–1.0)
Free Thyroxine Index: 1.6 (ref 1.2–4.9)
GGT: 15 IU/L (ref 0–60)
Globulin, Total: 2 g/dL (ref 1.5–4.5)
Glucose: 128 mg/dL — ABNORMAL HIGH (ref 65–99)
HDL: 47 mg/dL (ref >39)
Hematocrit: 39.8 % (ref 34.0–46.6)
Hemoglobin: 13.2 g/dL (ref 11.1–15.9)
Immature Grans (Abs): 0 10*3/uL (ref 0.0–0.1)
Immature Granulocytes: 0 %
Iron: 48 ug/dL (ref 27–159)
LDH: 145 IU/L (ref 119–226)
LDL Chol Calc (NIH): 99 mg/dL (ref 0–99)
Lymphocytes Absolute: 1.8 10*3/uL (ref 0.7–3.1)
Lymphs: 36 %
MCH: 28.8 pg (ref 26.6–33.0)
MCHC: 33.2 g/dL (ref 31.5–35.7)
MCV: 87 fL (ref 79–97)
Monocytes Absolute: 0.3 10*3/uL (ref 0.1–0.9)
Monocytes: 7 %
Neutrophils Absolute: 2.7 10*3/uL (ref 1.4–7.0)
Neutrophils: 53 %
Phosphorus: 3.8 mg/dL (ref 3.0–4.3)
Platelets: 293 10*3/uL (ref 150–450)
Potassium: 4.6 mmol/L (ref 3.5–5.2)
RBC: 4.58 x10E6/uL (ref 3.77–5.28)
RDW: 13.7 % (ref 11.7–15.4)
Sodium: 139 mmol/L (ref 134–144)
T3 Uptake Ratio: 24 % (ref 24–39)
T4, Total: 6.6 ug/dL (ref 4.5–12.0)
TSH: 0.997 u[IU]/mL (ref 0.450–4.500)
Total Protein: 6.7 g/dL (ref 6.0–8.5)
Triglycerides: 169 mg/dL — ABNORMAL HIGH (ref 0–149)
Uric Acid: 5.4 mg/dL (ref 3.0–7.2)
VLDL Cholesterol Cal: 29 mg/dL (ref 5–40)
WBC: 5.1 10*3/uL (ref 3.4–10.8)
eGFR: 101 mL/min/{1.73_m2} (ref >59)

## 2021-02-16 LAB — HGB A1C W/O EAG: Hgb A1c MFr Bld: 6.9 % — ABNORMAL HIGH (ref 4.8–5.6)

## 2021-02-16 NOTE — Addendum Note (Signed)
Addended by: Gerarda Fraction A on: 02/16/2021 11:36 AM   Modules accepted: Orders

## 2021-04-28 ENCOUNTER — Telehealth: Payer: Self-pay | Admitting: Registered Nurse

## 2021-04-28 DIAGNOSIS — G8929 Other chronic pain: Secondary | ICD-10-CM

## 2021-04-28 DIAGNOSIS — E119 Type 2 diabetes mellitus without complications: Secondary | ICD-10-CM

## 2021-04-28 DIAGNOSIS — E7849 Other hyperlipidemia: Secondary | ICD-10-CM

## 2021-04-28 MED ORDER — DICLOFENAC SODIUM 75 MG PO TBEC: 75.0000 mg | DELAYED_RELEASE_TABLET | Freq: Two times a day (BID) | ORAL | 0 refills | Status: AC | PRN

## 2021-04-28 MED ORDER — METFORMIN HCL 500 MG PO TABS: 500.0000 mg | ORAL_TABLET | Freq: Two times a day (BID) | ORAL | 0 refills | Status: DC

## 2021-04-28 MED ORDER — SIMVASTATIN 20 MG PO TABS: 20.0000 mg | ORAL_TABLET | Freq: Every day | ORAL | 0 refills | Status: DC

## 2021-04-28 NOTE — Telephone Encounter (Signed)
Last [facility] fill 01/20/21 labs 02/15/21  Setting up new appt UC provider never responded for request to new Rx.  Bridge refill metformin 567m po BID #180 RF0, simvastatin 224mpo daily #90 RF0, diclofenac DR 7526mo BID prn pain #30 RF0 dispensed from [facility] to patient today.  [facility] and had no further questions at this time. Results for PARCRISTLE, JAREDRN [id number] of 04/28/2021 15:54  Ref. Range 02/15/2021 09:10  Sodium Latest Ref Range: 134 - 144 mmol/L 139  Potassium Latest Ref Range: 3.5 - 5.2 mmol/L 4.6  Chloride Latest Ref Range: 96 - 106 mmol/L 103  Glucose Latest Ref Range: 65 - 99 mg/dL 128 (H)  BUN Latest Ref Range: 6 - 24 mg/dL 16  Creatinine Latest Ref Range: 0.57 - 1.00 mg/dL 0.70  Calcium Latest Ref Range: 8.7 - 10.2 mg/dL 9.2  BUN/Creatinine Ratio Latest Ref Range: 9 - 23  23  eGFR Latest Ref Range: >59 mL/min/1.73 101  Phosphorus Latest Ref Range: 3.0 - 4.3 mg/dL 3.8  Alkaline Phosphatase Latest Ref Range: 44 - 121 IU/L 72  Albumin Latest Ref Range: 3.8 - 4.9 g/dL 4.7  Albumin/Globulin Ratio Latest Ref Range: 1.2 - 2.2  2.4 (H)  Uric Acid Latest Ref Range: 3.0 - 7.2 mg/dL 5.4  AST Latest Ref Range: 0 - 40 IU/L 15  ALT Latest Ref Range: 0 - 32 IU/L 12  Total Protein Latest Ref Range: 6.0 - 8.5 g/dL 6.7  Total Bilirubin Latest Ref Range: 0.0 - 1.2 mg/dL 0.4  GGT Latest Ref Range: 0 - 60 IU/L 15  Estimated CHD Risk Latest Ref Range: 0.0 - 1.0 times avg. 0.7  LDH Latest Ref Range: 119 - 226 IU/L 145  Total CHOL/HDL Ratio Latest Ref Range: 0.0 - 4.4 ratio 3.7  Cholesterol, Total Latest Ref Range: 100 - 199 mg/dL 175  HDL Cholesterol Latest Ref Range: >39 mg/dL 47  Triglycerides Latest Ref Range: 0 - 149 mg/dL 169 (H)  VLDL Cholesterol Cal Latest Ref Range: 5 - 40 mg/dL 29  LDL Chol Calc (NIH) Latest Ref Range: 0 - 99 mg/dL 99  Iron Latest Ref Range: 27 - 159 ug/dL 48  Globulin, Total Latest Ref Range: 1.5 - 4.5  g/dL 2.0  WBC Latest Ref Range: 3.4 - 10.8 x10E3/uL 5.1  RBC Latest Ref Range: 3.77 - 5.28 x10E6/uL 4.58  Hemoglobin Latest Ref Range: 11.1 - 15.9 g/dL 13.2  HCT Latest Ref Range: 34.0 - 46.6 % 39.8  MCV Latest Ref Range: 79 - 97 fL 87  MCH Latest Ref Range: 26.6 - 33.0 pg 28.8  MCHC Latest Ref Range: 31.5 - 35.7 g/dL 33.2  RDW Latest Ref Range: 11.7 - 15.4 % 13.7  Platelets Latest Ref Range: 150 - 450 x10E3/uL 293  Neutrophils Latest Ref Range: Not Estab. % 53  Immature Granulocytes Latest Ref Range: Not Estab. % 0  NEUT# Latest Ref Range: 1.4 - 7.0 x10E3/uL 2.7  Lymphocyte # Latest Ref Range: 0.7 - 3.1 x10E3/uL 1.8  Monocytes Absolute Latest Ref Range: 0.1 - 0.9 x10E3/uL 0.3  Basophils Absolute Latest Ref Range: 0.0 - 0.2 x10E3/uL 0.1  Immature Grans (Abs) Latest Ref Range: 0.0 - 0.1 x10E3/uL 0.0  Lymphs Latest Ref Range: Not Estab. % 36  Monocytes Latest Ref Range: Not Estab. % 7  Basos Latest Ref Range: Not Estab. % 1  Eos Latest Ref Range: Not Estab. % 3  EOS (ABSOLUTE) Latest  Ref Range: 0.0 - 0.4 x10E3/uL 0.2  Hemoglobin A1C Latest Ref Range: 4.8 - 5.6 % 6.9 (H)  TSH Latest Ref Range: 0.450 - 4.500 uIU/mL 0.997  Thyroxine (T4) Latest Ref Range: 4.5 - 12.0 ug/dL 6.6  Free Thyroxine Index Latest Ref Range: 1.2 - 4.9  1.6  T3 Uptake Ratio Latest Ref Range: 24 - 39 % 24

## 2021-07-12 ENCOUNTER — Encounter: Payer: Self-pay | Admitting: Registered Nurse

## 2021-07-12 ENCOUNTER — Ambulatory Visit: Payer: Self-pay | Admitting: Registered Nurse

## 2021-07-12 ENCOUNTER — Other Ambulatory Visit: Payer: Self-pay

## 2021-07-12 VITALS — BP 166/95 | HR 83 | Temp 98.4°F

## 2021-07-12 DIAGNOSIS — R030 Elevated blood-pressure reading, without diagnosis of hypertension: Secondary | ICD-10-CM

## 2021-07-12 DIAGNOSIS — H1033 Unspecified acute conjunctivitis, bilateral: Secondary | ICD-10-CM

## 2021-07-12 MED ORDER — ERYTHROMYCIN 5 MG/GM OP OINT: 1.0000 "application " | TOPICAL_OINTMENT | Freq: Four times a day (QID) | OPHTHALMIC | 0 refills | Status: AC

## 2021-07-12 MED ORDER — NAPHAZOLINE-PHENIRAMINE 0.025-0.3 % OP SOLN: 1.0000 [drp] | Freq: Four times a day (QID) | OPHTHALMIC | 0 refills | Status: DC | PRN

## 2021-07-12 MED ORDER — CARBOXYMETHYLCELLULOSE SOD PF 0.5 % OP SOLN: 1.0000 [drp] | Freq: Three times a day (TID) | OPHTHALMIC | 0 refills | Status: AC | PRN

## 2021-07-12 MED ORDER — LORATADINE 10 MG PO TABS: 10.0000 mg | ORAL_TABLET | Freq: Every day | ORAL | 11 refills | Status: DC

## 2021-07-12 NOTE — Patient Instructions (Signed)
Bacterial Conjunctivitis, Adult ?Bacterial conjunctivitis is an infection of the clear membrane that covers the white part of the eye and the inner surface of the eyelid (conjunctiva). When the blood vessels in the conjunctiva become inflamed, the eye becomes red or pink. The eye often feels irritated or itchy. Bacterial conjunctivitis spreads easily from person to person (is contagious). It also spreads easily from one eye to the other eye. ?What are the causes? ?This condition is caused by bacteria. You may get the infection if you come into close contact with: ?A person who is infected with the bacteria. ?Items that are contaminated with the bacteria, such as a face towel, contact lens solution, or eye makeup. ?What increases the risk? ?You are more likely to develop this condition if: ?You are exposed to other people who have the infection. ?You wear contact lenses. ?You have a sinus infection. ?You have had a recent eye injury or surgery. ?You have a weak body defense system (immune system). ?You have a medical condition that causes dry eyes. ?What are the signs or symptoms? ?Symptoms of this condition include: ?Thick, yellowish discharge from the eye. This may turn into a crust on the eyelid overnight and cause your eyelids to stick together. ?Tearing or watery eyes. ?Itchy eyes. ?Burning feeling in your eyes. ?Eye redness. ?Swollen eyelids. ?Blurred vision. ?How is this diagnosed? ?This condition is diagnosed based on your symptoms and medical history. Your health care provider may also take a sample of discharge from your eye to find the cause of your infection. ?How is this treated? ?This condition may be treated with: ?Antibiotic eye drops or ointment to clear the infection more quickly and prevent the spread of infection to others. ?Antibiotic medicines taken by mouth (orally) to treat infections that do not respond to drops or ointments or that last longer than 10 days. ?Cool, wet cloths (cool  compresses) placed on the eyes. ?Artificial tears applied 2-6 times a day. ?Follow these instructions at home: ?Medicines ?Take or apply your antibiotic medicine as told by your health care provider. Do not stop using the antibiotic, even if your condition improves, unless directed by your health care provider. ?Take or apply over-the-counter and prescription medicines only as told by your health care provider. ?Be very careful to avoid touching the edge of your eyelid with the eye-drop bottle or the ointment tube when you apply medicines to the affected eye. This will keep you from spreading the infection to your other eye or to other people. ?Managing discomfort ?Gently wipe away any drainage from your eye with a warm, wet washcloth or a cotton ball. ?Apply a clean, cool compress to your eye for 10-20 minutes, 3-4 times a day. ?General instructions ?Do not wear contact lenses until the inflammation is gone and your health care provider says it is safe to wear them again. Ask your health care provider how to sterilize or replace your contact lenses before you use them again. Wear glasses until you can resume wearing contact lenses. ?Avoid wearing eye makeup until the inflammation is gone. Throw away any old eye cosmetics that may be contaminated. ?Change or wash your pillowcase every day. ?Do not share towels or washcloths. This may spread the infection. ?Wash your hands often with soap and water for at least 20 seconds and especially before touching your face or eyes. Use paper towels to dry your hands. ?Avoid touching or rubbing your eyes. ?Do not drive or use heavy machinery if your vision is blurred. ?Contact   a health care provider if: You have a fever. Your symptoms do not get better after 10 days. Get help right away if: You have a fever and your symptoms suddenly get worse. You have severe pain when you move your eye. You have facial pain, redness, or swelling. You have a sudden loss of  vision. Summary Bacterial conjunctivitis is an infection of the clear membrane that covers the white part of the eye and the inner surface of the eyelid (conjunctiva). Bacterial conjunctivitis spreads easily from eye to eye and from person to person (is contagious). Wash your hands often with soap and water for at least 20 seconds and especially before touching your face or eyes. Use paper towels to dry your hands. Take or apply your antibiotic medicine as told by your health care provider. Do not stop using the antibiotic even if your condition improves. Contact a health care provider if you have a fever or if your symptoms do not get better after 10 days. Get help right away if you have a sudden loss of vision. This information is not intended to replace advice given to you by your health care provider. Make sure you discuss any questions you have with your health care provider. Document Revised: 11/03/2020 Document Reviewed: 11/03/2020 Elsevier Patient Education  2022 [place]. Viral Conjunctivitis, Adult Viral conjunctivitis is an inflammation of the clear membrane that covers the white part of the eye and the inner surface of the eyelid (conjunctiva). The inflammation is caused by a viral infection. The blood vessels in the conjunctiva become enlarged, causing the eye to become red or pink and often itchy. It usually starts in one eye and goes to the other in a day or two. Infections often resolve over 1-2 weeks. Viral conjunctivitis is contagious. This means it can be easily passed from one person to another. This condition is often called pink eye. What are the causes? This condition is caused by a virus. It can be spread by touching objects that have been contaminated with the virus, such as doorknobs or towels, and then touching your eye. It can also be passed through tiny droplets, such as from coughing or sneezing. What increases the risk? You are more likely to develop this condition  if you have a cold or the flu, or are in close contact with a person with pink eye. What are the signs or symptoms? Symptoms of this condition include: Redness in the eye. Tearing or watery eyes. Itchy and irritated eyes. Burning feeling in the eyes. Clear drainage from the eye. Swollen eyelids. A gritty feeling in the eye. Light sensitivity. This condition often occurs with other symptoms, such as nasal congestion, cough, and fever. How is this diagnosed? This condition is diagnosed with a medical history and physical exam. If you have discharge from your eye, the discharge may be tested to rule out other causes of conjunctivitis. How is this treated? Viral conjunctivitis does not respond to medicines that kill bacteria (antibiotics). The condition most often resolves on its own in 1-2 weeks. If treatment is needed, it is aimed at relieving your symptoms and preventing the spread of infection. This may be done with artificial tear drops, antihistamine drops, or other eye medicines. In rare cases, steroid eye drops or anti-herpes virus medicines may be prescribed. Follow these instructions at home: Medicines  Take or apply over-the-counter and prescription medicines only as told by your health care provider. Do not touch the edge of the eyelid with the eye-drop  bottle or ointment tube when applying medicines to the affected eye. This will prevent the spread of the infection to the other eye or to other people. Eye care Avoid touching or rubbing your eyes. Apply a clean, cool, wet washcloth onto your eye for 10-20 minutes, 3-4 times per day, or as told by your health care provider. If you wear contact lenses, do not wear them until the inflammation is gone and your health care provider says it is safe to wear them again. Ask your health care provider how to disinfect or replace your contact lenses before using them again. Wear glasses until you can resume wearing contacts. Avoid wearing eye  makeup until the inflammation is gone. Throw away any old eye cosmetics that may be contaminated. Gently wipe away any crusting from your eye with a wet washcloth or a cotton ball. General instructions Change or wash your pillowcase every day or as told by your health care provider. Do not share towels, pillowcases, washcloths, eye makeup, makeup brushes, contact lenses, or eyeglasses. This may spread the infection. Wash your hands often with soap and water. Use paper towels to dry your hands. If soap and water are not available, use hand sanitizer. Avoid contact with other people until your eye is no longer red and tearing, or as told by your health care provider. Contact a health care provider if: Your symptoms do not improve with treatment, or they get worse. You have increased pain. Your vision becomes blurry. You have a fever. You have facial pain, redness, or swelling. You have yellow or green drainage coming from your eye. You have new symptoms. Get help right away if: You develop severe pain. Your vision gets much worse. Summary Viral conjunctivitis is an inflammation of the clear membrane that covers the white part of the eye and the inner surface of the eyelid. It usually goes away in 1-2 weeks. This condition is usually treated with medicines and cold compresses. Treatment focuses on relieving the symptoms. This condition is very contagious. To prevent infection, avoid close contact with others, wash your hands often, and do not share towels or washcloths. Contact a health care provider if your symptoms do not go away with treatment, or if you have more pain, poor vision, or swelling in the eyes. Get help right away if you have severe pain or your vision gets much worse. This information is not intended to replace advice given to you by your health care provider. Make sure you discuss any questions you have with your health care provider. Document Revised: 06/23/2019 Document  Reviewed: 06/06/2019 Elsevier Patient Education  2022 [place]. Allergic Conjunctivitis, Adult Allergic conjunctivitis is inflammation of the conjunctiva. The conjunctiva is the thin, clear membrane that covers the white part of the eye and the inner surface of the eyelid. In this condition: The blood vessels in the conjunctiva become irritated and swell. The eyes become red or pink and feel itchy. Allergic conjunctivitis cannot be spread from person to person. This condition can develop at any age and may be outgrown. What are the causes? This condition is caused by allergens. These are things that can cause an allergic reaction in some people but not in other people. Common allergens include: Outdoor allergens, such as: Pollen, including pollen from grass and weeds. Mold spores. Car fumes. Indoor allergens, such as: Dust. Smoke. Mold spores. Proteins in a pet's urine, saliva, or dander. What increases the risk? You may be more likely to develop this condition if  you have a family history of these things: Allergies. Conditions caused by being exposed to allergens, such as: Allergic rhinitis. This is an allergic reaction that affects the nose. Bronchial asthma. This condition affects the large airways in the lungs and makes breathing difficult. Atopic dermatitis (eczema). This is inflammation of the skin that is long-term (chronic). What are the signs or symptoms? Symptoms of this condition include eyes that are: Itchy. Red. Watery. Puffy. Your eyes may also: Sting or burn. Have clear fluid draining from them. Have thick mucus discharge and pain (vernal conjunctivitis). How is this diagnosed? This condition may be diagnosed by: Your medical history. A physical exam. Tests of the fluid draining from your eyes to rule out other causes. Other tests to confirm the diagnosis, including: Testing for allergies. The skin may be pricked with a tiny needle. The pricked area is  then exposed to small amounts of allergens. Testing for other eye conditions. Tests may include: Blood tests. Tissue scrapings from your eyelid. The tissue is then checked under a microscope. How is this treated? This condition may be treated with: Cold, wet cloths (cold compresses) to soothe itching and swelling. Washing the face to remove allergens. Eye drops. These may be prescription or over-the-counter. You may need to try different types to see which one works best for you, such as: Eye drops that block the allergic reaction (antihistamine). Eye drops that reduce swelling and irritation (anti-inflammatory). Steroid eye drops, which may be given if other treatments have not worked (vernal conjunctivitis). Oral antihistamine medicines. These are medicines taken by mouth to lessen your allergic reaction. You may need these if eye drops do not help or are difficult to use. Follow these instructions at home: Eye care Apply a clean, cold compress to your eyes for 10-20 minutes, 3-4 times a day. Do not touch or rub your eyes. Do not wear contact lenses until the inflammation is gone. Wear glasses instead. Do not wear eye makeup until the inflammation is gone. General instructions Avoid known allergens whenever possible. Take or apply over-the-counter and prescription medicines only as told by your health care provider. These include any eye drops. Drink enough fluid to keep your urine pale yellow. Keep all follow-up visits as told by your health care provider. This is important. Contact a health care provider if: Your symptoms get worse or do not get better with treatment. You have mild eye pain. You become sensitive to light. You have spots or blisters on your eyes. You have pus draining from your eyes. You have a fever. Get help right away if: You have redness, swelling, or other symptoms in only one eye. Your vision is blurred or you have other vision changes. You have severe eye  pain. Summary Allergic conjunctivitis is inflammation of the clear membrane that covers the white part of the eye and the inner surface of the eyelid. Take or apply over-the-counter and prescription medicines only as told by your health care provider. These include eye drops. Do not touch or rub your eyes. Contact a health care provider if your symptoms get worse or do not get better with treatment. This information is not intended to replace advice given to you by your health care provider. Make sure you discuss any questions you have with your health care provider. Document Revised: 06/16/2019 Document Reviewed: 06/16/2019 Elsevier Patient Education  2022 [employer].

## 2021-07-12 NOTE — Progress Notes (Signed)
Subjective:    Patient ID: [name], female    DOB: Dec 06, 1965, 55 y.o.   MRN: [id number]  55y/o caucasian female established female noted eyelid swelling/itching and redness started yesterday afternoon at work.  Eye discharge started later in the evening and worsening this am.  Denied fever/chills/n/v/d/cough/rhinitis/ear pain/dishcarge.  Applied compress this am and helped to remove mucous from eyelashes.  Patient reported swelling was worse when she woke up today could only see through a slit.  Denied known allergies or exposure to sick persons.  Grandchildren were at house this weekend.  Wears reading glasses/no contact use.  History of seasonal allergic rhinitis not taking anything at this time.  Patient requested her coworker Jovanka Milikic be in exam room with her.     Review of Systems  Constitutional:  Negative for activity change, appetite change, chills, diaphoresis, fatigue and fever.  HENT:  Positive for facial swelling. Negative for congestion, dental problem, drooling, ear discharge, ear pain, hearing loss, mouth sores, nosebleeds, postnasal drip, rhinorrhea, sinus pressure, sinus pain, sneezing, sore throat, tinnitus, trouble swallowing and voice change.   Eyes:  Positive for pain, discharge, redness and itching. Negative for photophobia.  Respiratory:  Negative for cough, shortness of breath, wheezing and stridor.   Cardiovascular:  Negative for chest pain.  Gastrointestinal:  Negative for diarrhea, nausea and vomiting.  Endocrine: Negative for cold intolerance and heat intolerance.  Genitourinary:  Negative for difficulty urinating.  Musculoskeletal:  Negative for back pain, gait problem, neck pain and neck stiffness.  Skin:  Positive for color change. Negative for wound.  Allergic/Immunologic: Positive for environmental allergies. Negative for food allergies.  Neurological:  Negative for dizziness, tremors, seizures, syncope, facial asymmetry, speech difficulty,  weakness, light-headedness, numbness and headaches.  Hematological:  Negative for adenopathy. Does not bruise/bleed easily.  Psychiatric/Behavioral:  Negative for agitation, confusion and sleep disturbance.       Objective:   Physical Exam Vitals and nursing note reviewed.  Constitutional:      General: She is awake. She is not in acute distress.    Appearance: Normal appearance. She is well-developed, well-groomed and normal weight. She is not ill-appearing, toxic-appearing or diaphoretic.  HENT:     Head: Normocephalic and atraumatic. No raccoon eyes, Battle's sign, abrasion, contusion, right periorbital erythema, left periorbital erythema or laceration.     Jaw: There is normal jaw occlusion. No trismus, tenderness, swelling, pain on movement or malocclusion.     Salivary Glands: Right salivary gland is not diffusely enlarged or tender. Left salivary gland is not diffusely enlarged or tender.     Right Ear: Hearing and external ear normal.     Left Ear: Hearing and external ear normal.     Nose: Nose normal. No congestion or rhinorrhea.     Right Sinus: No maxillary sinus tenderness or frontal sinus tenderness.     Left Sinus: No maxillary sinus tenderness or frontal sinus tenderness.     Mouth/Throat:     Lips: Pink. No lesions.     Mouth: Mucous membranes are moist.     Pharynx: Oropharynx is clear.  Eyes:     General: Lids are everted, no foreign bodies appreciated. Vision grossly intact. Gaze aligned appropriately. Allergic shiner present. No visual field deficit or scleral icterus.       Right eye: No foreign body, discharge or hordeolum.        Left eye: No foreign body, discharge or hordeolum.     Extraocular Movements: Extraocular movements intact.  Right eye: Normal extraocular motion and no nystagmus.     Left eye: Normal extraocular motion and no nystagmus.     Conjunctiva/sclera:     Right eye: Right conjunctiva is injected. Exudate present. No chemosis or  hemorrhage.    Left eye: Left conjunctiva is injected. Exudate present. No chemosis or hemorrhage.    Pupils: Pupils are equal, round, and reactive to light.     Right eye: No corneal abrasion or fluorescein uptake.     Left eye: No corneal abrasion or fluorescein uptake.     Comments: Upper and lower eyelids 2+/4 nonpitting edema; yellow green clear to cloudy discharge scattered on eyelids and eyelashes some wet and other areas dried; bilateral allergic shiners; macular erythema upper eyelids after patient rubs eyelids with tissue; fluorescein exam performed with woods lamp bilaterally no dye uptake cornea; blood vessels bilateral eyes excoriated especially superior to pupils  Neck:     Trachea: Trachea and phonation normal. No tracheal deviation.  Cardiovascular:     Rate and Rhythm: Normal rate and regular rhythm.     Pulses: Normal pulses.          Radial pulses are 2+ on the right side and 2+ on the left side.  Pulmonary:     Effort: Pulmonary effort is normal.     Breath sounds: Normal breath sounds and air entry. No stridor or transmitted upper airway sounds. No wheezing.     Comments: Spoke full sentences without difficulty; no cough observed in exam room Abdominal:     General: Abdomen is flat.  Musculoskeletal:        General: Normal range of motion.     Right hand: No swelling, deformity or lacerations. Normal range of motion. Normal strength.     Left hand: No swelling, deformity or lacerations. Normal range of motion. Normal strength.     Cervical back: Normal range of motion and neck supple. No swelling, edema, deformity, erythema, signs of trauma, lacerations, rigidity, torticollis, tenderness or crepitus. No pain with movement. Normal range of motion.     Right lower leg: No edema.     Left lower leg: No edema.  Lymphadenopathy:     Head:     Right side of head: No submental, submandibular, tonsillar, preauricular, posterior auricular or occipital adenopathy.     Left side  of head: No submental, submandibular, tonsillar, preauricular, posterior auricular or occipital adenopathy.     Cervical: No cervical adenopathy.     Right cervical: No superficial cervical adenopathy.    Left cervical: No superficial cervical adenopathy.  Skin:    General: Skin is warm and dry.     Capillary Refill: Capillary refill takes less than 2 seconds.     Coloration: Skin is not ashen, cyanotic, jaundiced, mottled, pale or sallow.     Findings: Erythema and rash present. No abscess, acne, bruising, burn, ecchymosis, signs of injury, laceration, lesion, petechiae or wound. Rash is macular. Rash is not crusting, nodular, papular, purpuric, pustular, scaling, urticarial or vesicular.     Nails: There is no clubbing.     Comments: Bilateral upper eyelids faint macular erythema after patient rubbing with tissue; on initial presentation only erythema at lash line  Neurological:     General: No focal deficit present.     Mental Status: She is alert and oriented to person, place, and time. Mental status is at baseline.     GCS: GCS eye subscore is 4. GCS verbal subscore is 5. GCS  motor subscore is 6.     Cranial Nerves: Cranial nerves 2-12 are intact. No cranial nerve deficit, dysarthria or facial asymmetry.     Sensory: Sensation is intact.     Motor: Motor function is intact. No weakness, tremor, atrophy, abnormal muscle tone or seizure activity.     Coordination: Coordination is intact. Coordination normal.     Gait: Gait is intact. Gait normal.     Comments: In/out of chair and on/off exam table without difficulty; gait sure and steady in clinic; bilateral hand grasp equal 5/5  Psychiatric:        Attention and Perception: Attention and perception normal.        Mood and Affect: Mood and affect normal.        Speech: Speech normal.        Behavior: Behavior normal. Behavior is cooperative.        Thought Content: Thought content normal.        Cognition and Memory: Cognition and  memory normal.        Judgment: Judgment normal.     Vision Screening   Right eye Left eye Both eyes  Without correction 20/30 20/20 20/20   With correction       [facility]    Assessment & Plan:   A-acute conjunctivitis bilaterally  P-Most likely viral or allergic. Wash hands before and after eye hygiene/medication administration.  Do not touch tip of container to eye/eyelashes.  Wash hands after touching face. Patient to go home today and start medication as prescribed.  Work excuse x 24 hours.  Hygiene discussed. Does not wear contacts. Patient to apply warm or cool packs prn right eye 5 minutes TID prn or warm compress.   Refresh drops 2 both eyes every 8 hours x 7 days given 5 UD from clinic stock  Erythromycin ointment ophthalmic 0.5% instill 1cm ribbon right eye BID x 7 days #1 RF0 dispensed from [facility] to patient.  Discussed blinking irritates eye blood vessels further as eyelid vessels rub on bulbar vessals. Instructed patient to not rub eyes. May need to wash pillowcases more frequently until infection resolves if discharge noted on pillow. May use over the counter eye drops/tears such as visine per manufacturer instructions for pain/symptom relief. Return to clinic if headache, fever greater than 100.58F, nausea/vomiting, purulent discharge/matting unable to open eye without using fingers after 24 hours of medication use, foreign body sensation, ciliary flush, worsening photophobia or vision. Discussed to see optometrist or ER same day if visual field loss, worsening light sensitivity, or orbital swelling.  Call or return to clinic as needed if these symptoms worsen or fail to improve as anticipated. [facility] handouts on bacterial/viral/allergic conjunctivitis printed and given to patient. Patient verbalized agreement and understanding of treatment plan and had no further questions at this time.  P2: Hand washing

## 2021-07-26 ENCOUNTER — Other Ambulatory Visit: Payer: Self-pay

## 2021-07-26 ENCOUNTER — Ambulatory Visit: Payer: Self-pay | Admitting: *Deleted

## 2021-07-26 VITALS — BP 128/80 | HR 84

## 2021-07-26 DIAGNOSIS — R030 Elevated blood-pressure reading, without diagnosis of hypertension: Secondary | ICD-10-CM

## 2021-07-26 NOTE — Progress Notes (Signed)
Routine BP check per pt request.

## 2021-08-02 ENCOUNTER — Ambulatory Visit: Payer: Self-pay | Admitting: *Deleted

## 2021-08-02 ENCOUNTER — Other Ambulatory Visit: Payer: Self-pay

## 2021-08-02 ENCOUNTER — Telehealth: Payer: Self-pay | Admitting: Registered Nurse

## 2021-08-02 VITALS — BP 148/91 | HR 73

## 2021-08-02 DIAGNOSIS — R030 Elevated blood-pressure reading, without diagnosis of hypertension: Secondary | ICD-10-CM

## 2021-08-02 DIAGNOSIS — M5430 Sciatica, unspecified side: Secondary | ICD-10-CM

## 2021-08-02 DIAGNOSIS — E785 Hyperlipidemia, unspecified: Secondary | ICD-10-CM

## 2021-08-02 DIAGNOSIS — E119 Type 2 diabetes mellitus without complications: Secondary | ICD-10-CM

## 2021-08-02 MED ORDER — METFORMIN HCL 500 MG PO TABS: 500.0000 mg | ORAL_TABLET | Freq: Two times a day (BID) | ORAL | 0 refills | Status: DC

## 2021-08-02 MED ORDER — DICLOFENAC SODIUM 75 MG PO TBEC
75.0000 mg | DELAYED_RELEASE_TABLET | Freq: Every day | ORAL | 0 refills | Status: AC | PRN
Start: 2021-08-02 — End: ?

## 2021-08-02 MED ORDER — SIMVASTATIN 20 MG PO TABS: 20.0000 mg | ORAL_TABLET | Freq: Every day | ORAL | 0 refills | Status: DC

## 2021-08-02 NOTE — Telephone Encounter (Signed)
Patient last filled Rx from [facility] 04/28/21.  First available appt with new PCM 30 Aug 2021 scheduled.  Ran out of medication this weekend would like bridge refill.  Last Labs 15 Feb 2021 renal and liver function normal Hgba1c 6.9 glucose fasting 128  Bridge refilled metformin 540m po BID #180 RF0, simvastatin 280mpo daily #90, diclofenac DR 7574mo daily prn pain #30 RF0.  [facility].  [facility] labs Exec/A1c fasting Jan/Feb 2023 scheduled with employer.  Consider adding B6/B12 and Vitamin D level with [facility] labs as long term metformin use.  As Vitamin D last tested 2014 33 and 2013 was 31.  Latest Reference Range & Units 02/15/21 09:10  Sodium 134 - 144 mmol/L 139  Potassium 3.5 - 5.2 mmol/L 4.6  Chloride 96 - 106 mmol/L 103  Glucose 65 - 99 mg/dL 128 (H)  BUN 6 - 24 mg/dL 16  Creatinine 0.57 - 1.00 mg/dL 0.70  Calcium 8.7 - 10.2 mg/dL 9.2  BUN/Creatinine Ratio 9 - 23  23  eGFR >59 mL/min/1.73 101  Phosphorus 3.0 - 4.3 mg/dL 3.8  Alkaline Phosphatase 44 - 121 IU/L 72  Albumin 3.8 - 4.9 g/dL 4.7  Albumin/Globulin Ratio 1.2 - 2.2  2.4 (H)  Uric Acid 3.0 - 7.2 mg/dL 5.4  AST 0 - 40 IU/L 15  ALT 0 - 32 IU/L 12  Total Protein 6.0 - 8.5 g/dL 6.7  Total Bilirubin 0.0 - 1.2 mg/dL 0.4  GGT 0 - 60 IU/L 15  Estimated CHD Risk 0.0 - 1.0 times avg. 0.7  LDH 119 - 226 IU/L 145  Total CHOL/HDL Ratio 0.0 - 4.4 ratio 3.7  Cholesterol, Total 100 - 199 mg/dL 175  HDL Cholesterol >39 mg/dL 47  Triglycerides 0 - 149 mg/dL 169 (H)  VLDL Cholesterol Cal 5 - 40 mg/dL 29  LDL Chol Calc (NIH) 0 - 99 mg/dL 99  Iron 27 - 159 ug/dL 48  Globulin, Total 1.5 - 4.5 g/dL 2.0  WBC 3.4 - 10.8 x10E3/uL 5.1  RBC 3.77 - 5.28 x10E6/uL 4.58  Hemoglobin 11.1 - 15.9 g/dL 13.2  HCT 34.0 - 46.6 % 39.8  MCV 79 - 97 fL 87  MCH 26.6 - 33.0 pg 28.8  MCHC 31.5 - 35.7 g/dL 33.2  RDW 11.7 - 15.4 % 13.7  Platelets 150 - 450 x10E3/uL 293  Neutrophils Not Estab. % 53   Immature Granulocytes Not Estab. % 0  NEUT# 1.4 - 7.0 x10E3/uL 2.7  Lymphocyte # 0.7 - 3.1 x10E3/uL 1.8  Monocytes Absolute 0.1 - 0.9 x10E3/uL 0.3  Basophils Absolute 0.0 - 0.2 x10E3/uL 0.1  Immature Grans (Abs) 0.0 - 0.1 x10E3/uL 0.0  Lymphs Not Estab. % 36  Monocytes Not Estab. % 7  Basos Not Estab. % 1  Eos Not Estab. % 3  EOS (ABSOLUTE) 0.0 - 0.4 x10E3/uL 0.2  Hemoglobin A1C 4.8 - 5.6 % 6.9 (H)  TSH 0.450 - 4.500 uIU/mL 0.997  Thyroxine (T4) 4.5 - 12.0 ug/dL 6.6  Free Thyroxine Index 1.2 - 4.9  1.6  T3 Uptake Ratio 24 - 39 % 24  (H): Data is abnormally high

## 2021-08-02 NOTE — Progress Notes (Signed)
Routine BP check per pt request.

## 2021-08-24 ENCOUNTER — Other Ambulatory Visit: Payer: Self-pay | Admitting: Family Medicine

## 2021-08-24 DIAGNOSIS — Z1231 Encounter for screening mammogram for malignant neoplasm of breast: Secondary | ICD-10-CM

## 2021-08-25 ENCOUNTER — Other Ambulatory Visit: Payer: Self-pay | Admitting: *Deleted

## 2021-08-25 ENCOUNTER — Other Ambulatory Visit: Payer: Self-pay

## 2021-08-25 VITALS — BP 136/78 | HR 84 | Wt 160.0 lb

## 2021-08-25 DIAGNOSIS — R7989 Other specified abnormal findings of blood chemistry: Secondary | ICD-10-CM

## 2021-08-25 DIAGNOSIS — E119 Type 2 diabetes mellitus without complications: Secondary | ICD-10-CM

## 2021-08-25 DIAGNOSIS — E785 Hyperlipidemia, unspecified: Secondary | ICD-10-CM

## 2021-08-25 NOTE — Progress Notes (Signed)
Fasting labs per NP Otila Kluver. Has CPE with pcp next week.

## 2021-08-26 ENCOUNTER — Encounter: Payer: Self-pay | Admitting: Registered Nurse

## 2021-08-26 LAB — MICROALBUMIN / CREATININE URINE RATIO
Creatinine, Urine: 137.3 mg/dL
Microalb/Creat Ratio: 9 mg/g creat (ref 0–29)
Microalbumin, Urine: 12.9 ug/mL

## 2021-08-26 MED ORDER — CHOLECALCIFEROL 1.25 MG (50000 UT) PO CAPS: 50000.0000 [IU] | ORAL_CAPSULE | ORAL | 0 refills | Status: AC

## 2021-08-26 NOTE — Progress Notes (Signed)
Results discussed with pt in clinic per NP notes. She is disappointed in the sugar level and says she does not drink enough water. Discussed diet and exercise recommendations for lowering A1c. She will pick up Vit D Rx. Appt emailed to pt's work calender for repeat A1c and vit D for 11/14/21. Printed handouts and hard copy of labs and provided to pt. Declines dietician information. CPE with pcp next week. Denies further questions or concerns.

## 2021-08-26 NOTE — Addendum Note (Signed)
Addended by: Gerarda Fraction A on: 08/26/2021 09:45 AM   Modules accepted: Orders

## 2021-08-26 NOTE — Progress Notes (Signed)
Noted patient reported has not been drinking much water.  Has follow up appt with PCM next week and will start Vitamin D Rx/pick up.

## 2021-08-28 LAB — CMP12+LP+TP+TSH+6AC+CBC/D/PLT
ALT: 19 IU/L (ref 0–32)
AST: 18 IU/L (ref 0–40)
Albumin/Globulin Ratio: 2 (ref 1.2–2.2)
Albumin: 4.6 g/dL (ref 3.8–4.9)
Alkaline Phosphatase: 66 IU/L (ref 44–121)
BUN/Creatinine Ratio: 27 — ABNORMAL HIGH (ref 9–23)
BUN: 17 mg/dL (ref 6–24)
Basophils Absolute: 0 10*3/uL (ref 0.0–0.2)
Basos: 1 %
Bilirubin Total: 0.5 mg/dL (ref 0.0–1.2)
Calcium: 9.5 mg/dL (ref 8.7–10.2)
Chloride: 101 mmol/L (ref 96–106)
Chol/HDL Ratio: 3.7 ratio (ref 0.0–4.4)
Cholesterol, Total: 192 mg/dL (ref 100–199)
Creatinine, Ser: 0.64 mg/dL (ref 0.57–1.00)
EOS (ABSOLUTE): 0.1 10*3/uL (ref 0.0–0.4)
Eos: 2 %
Estimated CHD Risk: 0.7 times avg. (ref 0.0–1.0)
Free Thyroxine Index: 1.9 (ref 1.2–4.9)
GGT: 21 IU/L (ref 0–60)
Globulin, Total: 2.3 g/dL (ref 1.5–4.5)
Glucose: 152 mg/dL — ABNORMAL HIGH (ref 70–99)
HDL: 52 mg/dL (ref >39)
Hematocrit: 39.5 % (ref 34.0–46.6)
Hemoglobin: 13.3 g/dL (ref 11.1–15.9)
Immature Grans (Abs): 0 10*3/uL (ref 0.0–0.1)
Immature Granulocytes: 0 %
Iron: 64 ug/dL (ref 27–159)
LDH: 148 IU/L (ref 119–226)
LDL Chol Calc (NIH): 114 mg/dL — ABNORMAL HIGH (ref 0–99)
Lymphocytes Absolute: 2 10*3/uL (ref 0.7–3.1)
Lymphs: 35 %
MCH: 28.9 pg (ref 26.6–33.0)
MCHC: 33.7 g/dL (ref 31.5–35.7)
MCV: 86 fL (ref 79–97)
Monocytes Absolute: 0.4 10*3/uL (ref 0.1–0.9)
Monocytes: 7 %
Neutrophils Absolute: 3 10*3/uL (ref 1.4–7.0)
Neutrophils: 55 %
Phosphorus: 4.5 mg/dL — ABNORMAL HIGH (ref 3.0–4.3)
Platelets: 275 10*3/uL (ref 150–450)
Potassium: 4.5 mmol/L (ref 3.5–5.2)
RBC: 4.61 x10E6/uL (ref 3.77–5.28)
RDW: 13.1 % (ref 11.7–15.4)
Sodium: 140 mmol/L (ref 134–144)
T3 Uptake Ratio: 26 % (ref 24–39)
T4, Total: 7.3 ug/dL (ref 4.5–12.0)
TSH: 1.01 u[IU]/mL (ref 0.450–4.500)
Total Protein: 6.9 g/dL (ref 6.0–8.5)
Triglycerides: 148 mg/dL (ref 0–149)
Uric Acid: 5.6 mg/dL (ref 3.0–7.2)
VLDL Cholesterol Cal: 26 mg/dL (ref 5–40)
WBC: 5.6 10*3/uL (ref 3.4–10.8)
eGFR: 102 mL/min/{1.73_m2} (ref >59)

## 2021-08-28 LAB — VITAMIN D 25 HYDROXY (VIT D DEFICIENCY, FRACTURES): Vit D, 25-Hydroxy: 24.2 ng/mL — ABNORMAL LOW (ref 30.0–100.0)

## 2021-08-28 LAB — HGB A1C W/O EAG: Hgb A1c MFr Bld: 7.7 % — ABNORMAL HIGH (ref 4.8–5.6)

## 2021-08-28 LAB — VITAMIN B6: Vitamin B6: 22.4 ug/L (ref 3.4–65.2)

## 2021-08-28 LAB — VITAMIN B12: Vitamin B-12: 349 pg/mL (ref 232–1245)

## 2021-08-30 ENCOUNTER — Ambulatory Visit
Admission: RE | Admit: 2021-08-30 | Discharge: 2021-08-30 | Disposition: A | Discharge status: Home / Self Care | Payer: No Typology Code available for payment source | Source: Ambulatory Visit | Attending: Family Medicine | Admitting: Family Medicine

## 2021-08-30 ENCOUNTER — Other Ambulatory Visit: Payer: Self-pay

## 2021-08-30 DIAGNOSIS — Z1231 Encounter for screening mammogram for malignant neoplasm of breast: Secondary | ICD-10-CM

## 2021-09-09 NOTE — Progress Notes (Signed)
Reviewed RN Haley note agreed with plan of care. 

## 2021-09-29 DIAGNOSIS — G8929 Other chronic pain: Secondary | ICD-10-CM | POA: Insufficient documentation

## 2021-09-30 DIAGNOSIS — I10 Essential (primary) hypertension: Secondary | ICD-10-CM | POA: Insufficient documentation

## 2021-09-30 DIAGNOSIS — G4700 Insomnia, unspecified: Secondary | ICD-10-CM | POA: Insufficient documentation

## 2021-10-28 DIAGNOSIS — R0609 Other forms of dyspnea: Secondary | ICD-10-CM | POA: Insufficient documentation

## 2021-11-01 ENCOUNTER — Ambulatory Visit: Payer: Self-pay | Admitting: *Deleted

## 2021-11-01 VITALS — BP 105/62 | HR 85

## 2021-11-01 DIAGNOSIS — Z0130 Encounter for examination of blood pressure without abnormal findings: Secondary | ICD-10-CM

## 2021-11-24 ENCOUNTER — Encounter: Payer: Self-pay | Admitting: Registered Nurse

## 2021-11-24 ENCOUNTER — Telehealth: Payer: Self-pay | Admitting: Registered Nurse

## 2021-11-24 DIAGNOSIS — J00 Acute nasopharyngitis [common cold]: Secondary | ICD-10-CM

## 2021-11-24 MED ORDER — SALINE SPRAY 0.65 % NA SOLN: 2.0000 | NASAL | 0 refills | Status: DC

## 2021-11-24 MED ORDER — ACETAMINOPHEN 500 MG PO TABS: 1000.0000 mg | ORAL_TABLET | Freq: Four times a day (QID) | ORAL | 0 refills | Status: AC | PRN

## 2021-11-24 MED ORDER — LORATADINE 10 MG PO TABS: 10.0000 mg | ORAL_TABLET | Freq: Every day | ORAL | 11 refills | Status: DC

## 2021-11-24 NOTE — Telephone Encounter (Signed)
Patient reported nasal congestion currently not taking any antihistamine or nasal sprays.  Has headache today also.  Patient may use normal saline nasal spray 2 sprays each nostril q2h wa as needed given 1 bottle from clinic stock.  Consider flonase 53mg 1 spray each nostril BID OTC.  Patient denied personal or family history of ENT cancer.  OTC antihistamine of choice claritin/zyrtec '10mg'$  po daily.   Given 2 UD loratadine '10mg'$  from clinic stock for trial and discussed to buy OTC this weekend if trial dosing helpful.  Discussed seasonal pollen levels currently high with spring bloom grass, trees.  Rain has increased fungal as 5 inches a week ago rain.  Adenovirus/metapneumovirus common in community viral URI can cause congestion and post nasal drip also.   Tylenol '1000mg'$  po q6h prn pain/headache.  Given 2 UD from clinic stock.  Encouraged patient to hydrate with water also.  Avoid triggers if possible.  Shower prior to bedtime if exposed to triggers.  If allergic dust/dust mites recommend mattress/pillow covers/encasements; washing linens, vacuuming, sweeping, dusting weekly.  Call or return to clinic as needed if these symptoms worsen or fail to improve as anticipated.    Patient verbalized understanding of instructions, agreed with plan of care and had no further questions at this time.  ?P2:  Avoidance and hand washing.  ?

## 2022-01-26 ENCOUNTER — Ambulatory Visit: Payer: Self-pay | Admitting: *Deleted

## 2022-01-26 DIAGNOSIS — E785 Hyperlipidemia, unspecified: Secondary | ICD-10-CM

## 2022-01-26 DIAGNOSIS — E1165 Type 2 diabetes mellitus with hyperglycemia: Secondary | ICD-10-CM

## 2022-01-26 DIAGNOSIS — E559 Vitamin D deficiency, unspecified: Secondary | ICD-10-CM

## 2022-01-26 NOTE — Progress Notes (Signed)
Labs per pcp.

## 2022-01-27 LAB — SPECIMEN STATUS

## 2022-01-31 LAB — COMPREHENSIVE METABOLIC PANEL
ALT: 24 IU/L (ref 0–32)
AST: 30 IU/L (ref 0–40)
Albumin/Globulin Ratio: 1.9 (ref 1.2–2.2)
Albumin: 4.5 g/dL (ref 3.8–4.9)
Alkaline Phosphatase: 56 IU/L (ref 44–121)
BUN/Creatinine Ratio: 16 (ref 9–23)
BUN: 11 mg/dL (ref 6–24)
Bilirubin Total: 0.3 mg/dL (ref 0.0–1.2)
CO2: 23 mmol/L (ref 20–29)
Calcium: 9.4 mg/dL (ref 8.7–10.2)
Chloride: 100 mmol/L (ref 96–106)
Creatinine, Ser: 0.69 mg/dL (ref 0.57–1.00)
Globulin, Total: 2.4 g/dL (ref 1.5–4.5)
Glucose: 157 mg/dL — ABNORMAL HIGH (ref 70–99)
Potassium: 4.7 mmol/L (ref 3.5–5.2)
Sodium: 136 mmol/L (ref 134–144)
Total Protein: 6.9 g/dL (ref 6.0–8.5)
eGFR: 101 mL/min/{1.73_m2} (ref >59)

## 2022-01-31 LAB — LIPID PANEL
Chol/HDL Ratio: 3.1 ratio (ref 0.0–4.4)
Cholesterol, Total: 136 mg/dL (ref 100–199)
HDL: 44 mg/dL (ref >39)
LDL Chol Calc (NIH): 58 mg/dL (ref 0–99)
Triglycerides: 211 mg/dL — ABNORMAL HIGH (ref 0–149)
VLDL Cholesterol Cal: 34 mg/dL (ref 5–40)

## 2022-01-31 LAB — VITAMIN D 25 HYDROXY (VIT D DEFICIENCY, FRACTURES): Vit D, 25-Hydroxy: 46.5 ng/mL (ref 30.0–100.0)

## 2022-02-01 LAB — SPECIMEN STATUS REPORT

## 2022-02-01 LAB — HGB A1C W/O EAG: Hgb A1c MFr Bld: 7.4 % — ABNORMAL HIGH (ref 4.8–5.6)

## 2022-02-02 NOTE — Progress Notes (Signed)
Reviewed RN Hildred Alamin results note agreed

## 2022-04-13 ENCOUNTER — Ambulatory Visit: Payer: No Typology Code available for payment source

## 2022-04-13 ENCOUNTER — Ambulatory Visit: Payer: No Typology Code available for payment source | Admitting: Registered Nurse

## 2022-04-13 DIAGNOSIS — E1165 Type 2 diabetes mellitus with hyperglycemia: Secondary | ICD-10-CM

## 2022-04-13 LAB — GLUCOSE, POCT (MANUAL RESULT ENTRY): POC Glucose: 110 mg/dl — AB (ref 70–99)

## 2022-04-13 MED ORDER — ONETOUCH VERIO VI STRP: ORAL_STRIP | 12 refills | Status: AC

## 2022-04-13 MED ORDER — ONETOUCH ULTRASOFT LANCETS MISC: 12 refills | Status: AC

## 2022-04-13 NOTE — Progress Notes (Signed)
Subjective:    Patient ID: [name], female    DOB: 1965-11-04, 56 y.o.   MRN: [id number]  56y/o caucasian female established patient here for presription refills [facility] metformin 1065m po BID and lisinopril 172mdaily.  Patient stated still has sufficient supply simvastatin at home.  Needs new Rx for her onetouch lancets and glucose strips sent to her pharmacy expired.  Hasn't scheduled her follow up appt with PCM yet.  Last labs 01/26/22 Hgba1c 7s.   [facility].  [facility].  Patient saw RN SuVinnie Levelhis am for POCT glucose check 110.        Review of Systems  Constitutional:  Negative for chills, diaphoresis and fever.  HENT:  Negative for trouble swallowing and voice change.   Eyes:  Negative for photophobia and visual disturbance.  Respiratory:  Negative for cough.   Cardiovascular:  Negative for chest pain.  Gastrointestinal:  Negative for diarrhea, nausea and vomiting.  Genitourinary:  Negative for difficulty urinating.  Musculoskeletal:  Negative for gait problem.  Skin:  Negative for rash.  Allergic/Immunologic: Negative for food allergies.  Neurological:  Negative for dizziness and headaches.  Psychiatric/Behavioral:  Negative for agitation, confusion and sleep disturbance.        Objective:   Physical Exam Vitals and nursing note reviewed.  Constitutional:      General: She is awake. She is not in acute distress.    Appearance: Normal appearance. She is well-developed, well-groomed and overweight. She is not ill-appearing, toxic-appearing or diaphoretic.  HENT:     Head: Normocephalic and atraumatic.     Jaw: There is normal jaw occlusion.     Salivary Glands: Right salivary gland is not diffusely enlarged. Left salivary gland is not diffusely enlarged.     Right Ear: Hearing and external ear normal.     Left Ear: Hearing and external ear normal.     Nose: Nose normal. No  congestion or rhinorrhea.     Mouth/Throat:     Lips: Pink. No lesions.     Mouth: Mucous membranes are moist. No oral lesions or angioedema.     Dentition: No gum lesions.     Tongue: No lesions. Tongue does not deviate from midline.     Palate: No mass and lesions.     Pharynx: Oropharynx is clear.  Eyes:     General: Lids are normal. Vision grossly intact. Gaze aligned appropriately. Allergic shiner present. No scleral icterus.       Right eye: No discharge.        Left eye: No discharge.     Extraocular Movements: Extraocular movements intact.     Conjunctiva/sclera: Conjunctivae normal.     Pupils: Pupils are equal, round, and reactive to light.  Neck:     Trachea: Trachea and phonation normal. No tracheal deviation.  Pulmonary:     Effort: Pulmonary effort is normal.     Breath sounds: Normal breath sounds and air entry. No stridor or transmitted upper airway sounds. No wheezing.     Comments: Spoke full sentences without difficulty; no cough observed in exam room Abdominal:     General: Abdomen is flat.  Musculoskeletal:        General: Normal range of motion.     Cervical back: Normal range of motion and neck supple. No swelling, edema, deformity, erythema, signs of trauma, lacerations, rigidity, torticollis or crepitus. Normal  range of motion.     Thoracic back: No swelling, edema, deformity, signs of trauma or lacerations. Normal range of motion.  Lymphadenopathy:     Head:     Right side of head: No submandibular or preauricular adenopathy.     Left side of head: No submandibular or preauricular adenopathy.     Cervical:     Right cervical: No superficial cervical adenopathy.    Left cervical: No superficial cervical adenopathy.  Skin:    General: Skin is warm and dry.     Capillary Refill: Capillary refill takes less than 2 seconds.     Coloration: Skin is not ashen, cyanotic, jaundiced, mottled, pale or sallow.     Findings: No abrasion, abscess, acne, bruising,  burn, ecchymosis, erythema, signs of injury, laceration, lesion, petechiae, rash or wound.     Nails: There is no clubbing.  Neurological:     General: No focal deficit present.     Mental Status: She is alert and oriented to person, place, and time. Mental status is at baseline.     GCS: GCS eye subscore is 4. GCS verbal subscore is 5. GCS motor subscore is 6.     Cranial Nerves: Cranial nerves 2-12 are intact. No dysarthria or facial asymmetry.     Motor: Motor function is intact. No weakness, tremor, atrophy, abnormal muscle tone or seizure activity.     Coordination: Coordination is intact. Coordination normal.     Gait: Gait is intact. Gait normal.     Comments: In/out of chair without difficulty; gait sure and steady in clinic; bilateral hand grasp equal 5/5  Psychiatric:        Attention and Perception: Attention and perception normal.        Mood and Affect: Mood and affect normal.        Speech: Speech normal.        Behavior: Behavior normal. Behavior is cooperative.        Thought Content: Thought content normal.        Cognition and Memory: Cognition and memory normal.        Judgment: Judgment normal.       Per paper chart review last filled metformin 1078m po BID #180 and lisinopril 130mpo daily #90 01/24/22.  Simvastatin 403mo daily #90 12/30/21.   Latest Reference Range & Units 01/26/22 11:25  Sodium 134 - 144 mmol/L 136  Potassium 3.5 - 5.2 mmol/L 4.7  Chloride 96 - 106 mmol/L 100  CO2 20 - 29 mmol/L 23  Glucose 70 - 99 mg/dL 157 (H)  BUN 6 - 24 mg/dL 11  Creatinine 0.57 - 1.00 mg/dL 0.69  Calcium 8.7 - 10.2 mg/dL 9.4  BUN/Creatinine Ratio 9 - 23  16  eGFR >59 mL/min/1.73 101  Alkaline Phosphatase 44 - 121 IU/L 56  Albumin 3.8 - 4.9 g/dL 4.5  Albumin/Globulin Ratio 1.2 - 2.2  1.9  AST 0 - 40 IU/L 30  ALT 0 - 32 IU/L 24  Total Protein 6.0 - 8.5 g/dL 6.9  Total Bilirubin 0.0 - 1.2 mg/dL 0.3  Total CHOL/HDL Ratio 0.0 - 4.4 ratio 3.1  Cholesterol, Total 100  - 199 mg/dL 136  HDL Cholesterol >39 mg/dL 44  Triglycerides 0 - 149 mg/dL 211 (H)  VLDL Cholesterol Cal 5 - 40 mg/dL 34  LDL Chol Calc (NIH) 0 - 99 mg/dL 58  Vitamin D, 25-Hydroxy 30.0 - 100.0 ng/mL 46.5  Globulin, Total 1.5 - 4.5 g/dL 2.4  WBC  WILL FOLLOW (P)  RBC  WILL FOLLOW (P)  Hemoglobin  WILL FOLLOW (P)  HCT  WILL FOLLOW (P)  MCV  WILL FOLLOW (P)  MCH  WILL FOLLOW (P)  MCHC  WILL FOLLOW (P)  RDW  WILL FOLLOW (P)  Platelets  WILL FOLLOW (P)  Neutrophils  WILL FOLLOW (P)  Immature Granulocytes  WILL FOLLOW (P)  NEUT#  WILL FOLLOW (P)  Lymphocyte #  WILL FOLLOW (P)  Monocytes Absolute  WILL FOLLOW (P)  Basophils Absolute  WILL FOLLOW (P)  Immature Grans (Abs)  WILL FOLLOW (P)  Lymphs  WILL FOLLOW (P)  Monocytes  WILL FOLLOW (P)  Basos  WILL FOLLOW (P)  Eos  WILL FOLLOW (P)  EOS (ABSOLUTE)  WILL FOLLOW (P)  Hemoglobin A1C 4.8 - 5.6 % 7.4 (H)  (H): Data is abnormally high (P): Preliminary  Lab results reviewed with patient in clinic today again.    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  Encouraged her to make healthy choices regarding diet keep added sugars less than 25grams per day/100 calories, lean protein 4 oz with each meal, whole grains/fruits/vegetables with each meal.  Exercise 150 minutes per week.  Scheduled follow up with PCM. Bring new Rx to clinic for her next metofrmin fill.   Next labs due end of month Hgba1c and patient scheduled first week of October with RN SDarletta Mollnonfasting.  Avoid dehydration/drink water to keep urine pale yellow clear and voiding every 2-4 hours while awake.  Take medication every day.  Patient verbalized understanding information/instructions,  agreed with plan of care and had no further questions at this time.  Bridge refilled lisinopril 119mpo daily #90 RF0 dispensed from [facility] to patient today. Saw cardiology in June and to continue imdur/metoprolol and lisinopril. Cardiology Rx expired 03/29/22 no refills remaining.  Patient to schedule follow up with PCM.  Bring new Rx to clinic for next fill.  Patient verbalized understanding information/instructions, agreed with plan of care and had no further questions at this time.

## 2022-04-13 NOTE — Patient Instructions (Signed)
Living With Diabetes Diabetes (type 1 diabetes mellitus or type 2 diabetes mellitus) is a condition in which the body does not have enough of a hormone called insulin, or the body does not respond properly to insulin. Normally, insulin allows sugars (glucose) to enter cells in the body. With diabetes, extra glucose builds up in the blood instead of going into cells. This results in high blood glucose (hyperglycemia). How to manage lifestyle changes Managing diabetes includes medical treatments as well as lifestyle changes. If diabetes is not managed well, serious physical and emotional complications can occur. Taking good care of yourself means that you are responsible for: Monitoring glucose regularly. Eating a healthy diet. Exercising regularly. Meeting with health care providers. Taking medicines as directed. Most people feel some stress about managing their diabetes. When this stress becomes too much, it is known as diabetes-related distress. This is very common. Living with diabetes can place you at risk for diabetes distress, depression, or anxiety. These disorders can make diabetes more difficult to manage. How to recognize stress You may have diabetes distress if you: Avoid or ignore your daily diabetes care. This includes glucose testing, following a meal plan, and taking medications. Feel overwhelmed by your daily diabetes care. Experience emotional reactions such as anger, sadness, or fear related to your daily diabetes care. Feel fear or shame about not doing everything perfectly that you have been told to do. Emotional distress Symptoms of diabetes distress include: Anger about having a diagnosis of diabetes. Fear or frustration about your diagnosis and the changes you need to make to manage the condition. Being overly worried about the care that you need or the cost of the care that you need. Feeling like you caused your condition by doing something wrong. Fear about unpredictable  fluctuations in your blood glucose, like low or high blood glucose. Feeling judged by your health care providers. Feeling very alone with the disease. Depression Having diabetes means that you are at a higher risk for depression. Your health care provider may test (screen) you for symptoms of depression. It is important to recognize symptoms and to start treatment for depression soon after it is diagnosed. The following are some symptoms of depression: Loss of interest in things that you used to enjoy. Feeling depressed much or most of the time. A change in appetite. Trouble getting to sleep or staying asleep. Feeling tired most of the day. Feeling nervous and anxious. Feeling guilty and worrying that you are a burden to others. Having thoughts of hurting yourself or feeling that you want to die. If you have any of these symptoms, more days than not, for 2 weeks or longer, you may have depression. This would be a good time to contact your health care provider. Follow these instructions at home: Managing diabetes distress The following are some ways to manage emotional distress: Learn as much as you can about diabetes and its treatment. Take one step at a time to improve your management. Meet with a certified diabetes care and education specialist. Take a class to learn how to manage your condition. Consider working with a counselor or therapist. Keep a journal of your thoughts and concerns. Accept that some things are out of your control. Talk with other people who have diabetes. It can help to talk about the distress that you feel. Find ways to manage stress that work for you. These may include art or music therapy, exercise, meditation, and hobbies. Seek support from spiritual leaders, family, and friends.  General instructions Do your best to follow your diabetes management plan. If you are struggling to follow your plan, talk with a certified diabetes care and education specialist, or  with someone else who has diabetes. They may have ideas that will help. Forgive yourself for not being perfect. Almost everyone struggles with the tasks of diabetes. Keep all follow-up visits. This is important. Where to find support Search for information and support from the American Diabetes Association: www.diabetes.org Find a certified diabetes education and care specialist. Make an appointment through the Association of Diabetes Care & Education Specialists: www.diabeteseducator.org Contact a health care provider if: You believe your diabetes is getting out of control. You are concerned you may be depressed. You think your medications are not helping control your diabetes. You are feeling overwhelmed with your diabetes. Get help right away if: You have thoughts about hurting yourself or others. If you ever feel like you may hurt yourself or others, or have thoughts about taking your own life, get help right away. You can go to your nearest emergency department or call: Your local emergency services (911 in the [place] A suicide crisis helpline, such as the [place] at 228-350-6598 or 988 in the [place]. This is open 24 hours a day. Summary Diabetes (type 1 diabetes mellitus or type 2 diabetes mellitus) is a condition in which the body does not have enough of a hormone called insulin, or the body does not respond properly to insulin. Living with diabetes puts you at risk for medical and emotional issues, such as diabetes distress, depression, and anxiety. Recognizing the symptoms of diabetes distress and depression may help you avoid problems with your diabetes control. If you experience symptoms, it is important to discuss this with your health care provider, certified diabetes care and education specialist, or therapist. It is important to start treatment for diabetes distress and depression soon after diagnosis. Ask your health care provider to recommend a  therapist who understands both depression and diabetes. This information is not intended to replace advice given to you by your health care provider. Make sure you discuss any questions you have with your health care provider. Document Revised: 02/16/2021 Document Reviewed: 12/04/2019 Elsevier Patient Education  [place].

## 2022-04-13 NOTE — Patient Instructions (Signed)
Glucose check 110

## 2022-04-28 ENCOUNTER — Ambulatory Visit: Payer: No Typology Code available for payment source

## 2022-05-09 ENCOUNTER — Other Ambulatory Visit: Payer: No Typology Code available for payment source

## 2022-05-09 DIAGNOSIS — E1165 Type 2 diabetes mellitus with hyperglycemia: Secondary | ICD-10-CM

## 2022-05-09 NOTE — Progress Notes (Signed)
Labs drawn from L antecubital without difficulty.2x2 and coflex applied.  Patient instructed to leave in place 15-20 minutes and then replace with bandaid given to patient.  Patient verbalized understanding.

## 2022-05-10 ENCOUNTER — Other Ambulatory Visit: Payer: Self-pay | Admitting: Registered Nurse

## 2022-05-10 DIAGNOSIS — E1165 Type 2 diabetes mellitus with hyperglycemia: Secondary | ICD-10-CM

## 2022-05-10 LAB — HGB A1C W/O EAG: Hgb A1c MFr Bld: 7.7 % — ABNORMAL HIGH (ref 4.8–5.6)

## 2022-05-11 ENCOUNTER — Other Ambulatory Visit: Payer: No Typology Code available for payment source

## 2022-06-27 ENCOUNTER — Encounter: Payer: Self-pay | Admitting: Registered Nurse

## 2022-06-27 ENCOUNTER — Ambulatory Visit: Payer: No Typology Code available for payment source | Admitting: Registered Nurse

## 2022-06-27 VITALS — HR 76 | Resp 16

## 2022-06-27 DIAGNOSIS — R300 Dysuria: Secondary | ICD-10-CM

## 2022-06-27 LAB — POCT URINALYSIS DIPSTICK OB
Bilirubin, UA: NEGATIVE
Glucose, UA: NEGATIVE
Ketones, UA: NEGATIVE
Leukocytes, UA: NEGATIVE
Nitrite, UA: NEGATIVE
POC,PROTEIN,UA: NEGATIVE
Spec Grav, UA: 1.025 (ref 1.010–1.025)
Urobilinogen, UA: 0.2 E.U./dL
pH, UA: 5 (ref 5.0–8.0)

## 2022-06-27 MED ORDER — PHENAZOPYRIDINE HCL 200 MG PO TABS: 200.0000 mg | ORAL_TABLET | Freq: Three times a day (TID) | ORAL | 0 refills | Status: AC | PRN

## 2022-06-27 NOTE — Patient Instructions (Signed)
Hematuria, Adult Hematuria is blood in the urine. Blood may be visible in the urine, or it may be identified with a test. This condition can be caused by infections of the bladder, urethra, kidney, or prostate. Other possible causes include: Kidney stones. Cancer of the urinary tract. Too much calcium in the urine. Conditions that are passed from parent to child (inherited conditions). Exercise that requires a lot of energy. Infections can usually be treated with medicine, and a kidney stone usually will pass through your urine. If neither of these is the cause of your hematuria, more tests may be needed to identify the cause of your symptoms. It is very important to tell your health care provider about any blood in your urine, even if it is painless or the blood stops without treatment. Blood in the urine, when it happens and then stops and then happens again, can be a symptom of a very serious condition, including cancer. There is no pain in the initial stages of many urinary cancers. Follow these instructions at home: Medicines Take over-the-counter and prescription medicines only as told by your health care provider. If you were prescribed an antibiotic medicine, take it as told by your health care provider. Do not stop taking the antibiotic even if you start to feel better. Eating and drinking Drink enough fluid to keep your urine pale yellow. It is recommended that you drink 3-4 quarts (2.8-3.8 L) a day. If you have been diagnosed with an infection, drinking cranberry juice in addition to large amounts of water is recommended. Avoid caffeine, tea, and carbonated beverages. These tend to irritate the bladder. Avoid alcohol because it may irritate the prostate (in males). General instructions If you have been diagnosed with a kidney stone, follow your health care provider's instructions about straining your urine to catch the stone. Empty your bladder often. Avoid holding urine for long  periods of time. If you are female: After a bowel movement, wipe from front to back and use each piece of toilet paper only once. Empty your bladder before and after sex. Pay attention to any changes in your symptoms. Tell your health care provider about any changes or any new symptoms. It is up to you to get the results of any tests. Ask your health care provider, or the department that is doing the test, when your results will be ready. Keep all follow-up visits. This is important. Contact a health care provider if: You develop back pain. You have a fever or chills. You have nausea or vomiting. Your symptoms do not improve after 3 days. Your symptoms get worse. Get help right away if: You develop severe vomiting and are unable to take medicine without vomiting. You develop severe pain in your back or abdomen even though you are taking medicine. You pass a large amount of blood in your urine. You pass blood clots in your urine. You feel very weak or like you might faint. You faint. Summary Hematuria is blood in the urine. It has many possible causes. It is very important that you tell your health care provider about any blood in your urine, even if it is painless or the blood stops without treatment. Take over-the-counter and prescription medicines only as told by your health care provider. Drink enough fluid to keep your urine pale yellow. This information is not intended to replace advice given to you by your health care provider. Make sure you discuss any questions you have with your health care provider. Document Revised: 03/24/2020   Document Reviewed: 03/24/2020 Elsevier Patient Education  [place]. Dysuria Dysuria is pain or discomfort during urination. The pain or discomfort may be felt in the part of the body that drains urine from the bladder (urethra) or in the surrounding tissue of the genitals. The pain may also be felt in the groin area, lower abdomen, or lower  back. You may have to urinate frequently or have the sudden feeling that you have to urinate (urgency). Dysuria can affect anyone, but it is more common in females. Dysuria can be caused by many different things, including: Urinary tract infection. Kidney stones or bladder stones. Certain STIs (sexually transmitted infections), such as chlamydia. Dehydration. Inflammation of the tissues of the vagina. Use of certain medicines. Use of certain soaps or scented products that cause irritation. Follow these instructions at home: Medicines Take over-the-counter and prescription medicines only as told by your health care provider. If you were prescribed an antibiotic medicine, take it as told by your health care provider. Do not stop taking the antibiotic even if you start to feel better. Eating and drinking  Drink enough fluid to keep your urine pale yellow. Avoid caffeinated beverages, tea, and alcohol. These beverages can irritate the bladder and make dysuria worse. In males, alcohol may irritate the prostate. General instructions Watch your condition for any changes. Urinate often. Avoid holding urine for long periods of time. If you are female, you should wipe from front to back after urinating or having a bowel movement. Use each piece of toilet paper only once. Empty your bladder after sex. Keep all follow-up visits. This is important. If you had any tests done to find the cause of dysuria, it is up to you to get your test results. Ask your health care provider, or the department that is doing the test, when your results will be ready. Contact a health care provider if: You have a fever. You develop pain in your back or sides. You have nausea or vomiting. You have blood in your urine. You are not urinating as often as you usually do. Get help right away if: Your pain is severe and not relieved with medicines. You cannot eat or drink without vomiting. You are confused. You have a  rapid heartbeat while resting. You have shaking or chills. You feel extremely weak. Summary Dysuria is pain or discomfort while urinating. Many different conditions can lead to dysuria. If you have dysuria, you may have to urinate frequently or have the sudden feeling that you have to urinate (urgency). Watch your condition for any changes. Keep all follow-up visits. Make sure that you urinate often and drink enough fluid to keep your urine pale yellow. This information is not intended to replace advice given to you by your health care provider. Make sure you discuss any questions you have with your health care provider. Document Revised: 03/05/2020 Document Reviewed: 03/05/2020 Elsevier Patient Education  [place].

## 2022-06-27 NOTE — Progress Notes (Signed)
Subjective:    Patient ID: [name], female    DOB: Apr 12, 1966, 56 y.o.   MRN: [id number]  56y/o caucasian established female patient here for evaluation suprapubic pain/pressure.  Denied gross hematuria, headache, flank pain, n/v/d, fever, chills, cloudy urine, frequency.  Patient also requested new Rx for gabapentin tried her spouse's Rx and stated helped a great deal with low back pain that radiates into her leg/thigh.      Review of Systems  Constitutional:  Negative for activity change, appetite change, chills, diaphoresis, fatigue and fever.  HENT:  Negative for trouble swallowing and voice change.   Eyes:  Negative for photophobia and visual disturbance.  Respiratory:  Negative for cough, shortness of breath, wheezing and stridor.   Gastrointestinal:  Negative for diarrhea, nausea and vomiting.  Genitourinary:  Negative for decreased urine volume, difficulty urinating, enuresis, flank pain, frequency, genital sores, hematuria, menstrual problem, urgency, vaginal bleeding, vaginal discharge and vaginal pain.  Musculoskeletal:  Positive for back pain. Negative for gait problem, myalgias, neck pain and neck stiffness.  Skin:  Negative for rash.  Allergic/Immunologic: Negative for food allergies.  Neurological:  Negative for dizziness, tremors and weakness.  Psychiatric/Behavioral:  Negative for agitation, confusion and sleep disturbance.        Objective:   Physical Exam Vitals and nursing note reviewed.  Constitutional:      General: She is awake. She is not in acute distress.    Appearance: Normal appearance. She is well-developed and well-groomed. She is not ill-appearing, toxic-appearing or diaphoretic.  HENT:     Head: Normocephalic and atraumatic.     Jaw: There is normal jaw occlusion.     Salivary Glands: Right salivary gland is not diffusely enlarged or tender. Left salivary gland is not diffusely enlarged or tender.     Right Ear: Hearing and external ear  normal.     Left Ear: Hearing and external ear normal.     Nose: Nose normal. No congestion or rhinorrhea.     Mouth/Throat:     Lips: Pink. No lesions.     Mouth: Mucous membranes are moist. No oral lesions.     Tongue: No lesions. Tongue does not deviate from midline.     Pharynx: Oropharynx is clear.     Tonsils: No tonsillar exudate.  Eyes:     General: Lids are normal. Vision grossly intact. Gaze aligned appropriately. Allergic shiner present. No scleral icterus.       Right eye: No discharge.        Left eye: No discharge.     Extraocular Movements: Extraocular movements intact.     Conjunctiva/sclera: Conjunctivae normal.     Pupils: Pupils are equal, round, and reactive to light.  Neck:     Trachea: Trachea and phonation normal. No tracheal deviation.  Cardiovascular:     Rate and Rhythm: Normal rate and regular rhythm.     Pulses: Normal pulses.          Radial pulses are 2+ on the right side and 2+ on the left side.     Heart sounds: Normal heart sounds.  Pulmonary:     Effort: Pulmonary effort is normal. No respiratory distress.     Breath sounds: Normal breath sounds and air entry. No stridor or transmitted upper airway sounds. No decreased breath sounds, wheezing, rhonchi or rales.     Comments: Spoke full sentences without difficulty; no cough observed in exam room Abdominal:     General: Abdomen is flat.  Bowel sounds are decreased.     Palpations: Abdomen is soft. There is no shifting dullness, fluid wave, hepatomegaly, splenomegaly, mass or pulsatile mass.     Tenderness: There is abdominal tenderness in the suprapubic area. There is no right CVA tenderness, left CVA tenderness, guarding or rebound. Negative signs include Murphy's sign.     Hernia: No hernia is present. There is no hernia in the umbilical area or ventral area.     Comments: Hypoactive bowel sounds x 4 quads; dull to percussion x 4 quads; mildly TTP suprapubic  Musculoskeletal:        General: Normal  range of motion.     Right hand: Normal strength. Normal capillary refill.     Left hand: Normal strength. Normal capillary refill.     Cervical back: Normal range of motion and neck supple. No swelling, edema, deformity, erythema, signs of trauma, lacerations, rigidity, torticollis, tenderness or crepitus. No pain with movement. Normal range of motion.     Thoracic back: No swelling, edema, deformity, signs of trauma, lacerations, spasms or tenderness. Normal range of motion.     Lumbar back: No swelling, edema, deformity, signs of trauma, lacerations, spasms or tenderness.     Right lower leg: No edema.     Left lower leg: No edema.     Right ankle: No swelling or ecchymosis.     Left ankle: No swelling or ecchymosis.  Lymphadenopathy:     Head:     Right side of head: No submandibular or preauricular adenopathy.     Left side of head: No submandibular or preauricular adenopathy.     Cervical:     Right cervical: No superficial cervical adenopathy.    Left cervical: No superficial cervical adenopathy.  Skin:    General: Skin is warm and dry.     Capillary Refill: Capillary refill takes less than 2 seconds.     Coloration: Skin is not ashen, cyanotic, jaundiced, mottled, pale or sallow.     Findings: No abrasion, abscess, acne, bruising, burn, ecchymosis, erythema, signs of injury, laceration, lesion, petechiae, rash or wound.     Nails: There is no clubbing.  Neurological:     General: No focal deficit present.     Mental Status: She is alert and oriented to person, place, and time. Mental status is at baseline.     GCS: GCS eye subscore is 4. GCS verbal subscore is 5. GCS motor subscore is 6.     Cranial Nerves: Cranial nerves 2-12 are intact.     Sensory: Sensation is intact.     Motor: Motor function is intact. No weakness, tremor, atrophy, abnormal muscle tone, seizure activity or pronator drift.     Coordination: Coordination is intact. Coordination normal.     Gait: Gait is  intact. Gait normal.     Comments: In/out of chair without difficulty; gait sure and steady in clinic; bilateral hand grasp equal 5/5  Psychiatric:        Attention and Perception: Attention and perception normal.        Mood and Affect: Mood and affect normal.        Speech: Speech normal.        Behavior: Behavior normal. Behavior is cooperative.        Thought Content: Thought content normal.        Cognition and Memory: Cognition and memory normal.        Judgment: Judgment normal.       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  Medications as directed. Patient is also to push fluids and may use Pyridium '200mg'$  po TID as needed #12 Rf0 electronic rx sent to her pharmacy of choice.  Discussed urinalysis results with patient microscopic hematuria and concentrated urine.  Will send my chart message with urine culture results. Hydrate, avoid dehydration. Avoid holding urine void on frequent basis every 4 to 6 hours. If unable to void every 8 hours follow up for re-evaluation with PCM, urgent care or ER. Call or return to clinic as needed if these symptoms worsen or fail to improve as anticipated. Exitcare handout on dysuria.  Discussed concentrated urine/some foods can irritate bladder and cause bladder spasms.  Patient verbalized agreement and understanding of treatment plan and had no further questions at this time.  P2: Hydrate and cranberry juice    Patient notified due to contract restrictions unable to prescribe controlled substances in this clinic.  Will need to discuss initiating gabapentin therapy with her PCM for low back pain.  Denied new or worsening symptoms just not getting enough relief with diclofenac/NSAIDS.  Denied red flags e.g. saddle paresthesias, arm/leg weakness, falls, loss of bowel/bladder control.  Patient verbalized understanding information/instructions, agreed with plan of care and had  no further questions at this time.

## 2022-06-30 LAB — URINE CULTURE

## 2022-08-10 ENCOUNTER — Encounter: Payer: Self-pay | Admitting: Registered Nurse

## 2022-08-10 ENCOUNTER — Ambulatory Visit: Payer: No Typology Code available for payment source | Admitting: Registered Nurse

## 2022-08-10 VITALS — BP 120/89 | HR 73

## 2022-08-10 DIAGNOSIS — E785 Hyperlipidemia, unspecified: Secondary | ICD-10-CM

## 2022-08-10 DIAGNOSIS — I10 Essential (primary) hypertension: Secondary | ICD-10-CM

## 2022-08-10 DIAGNOSIS — E1165 Type 2 diabetes mellitus with hyperglycemia: Secondary | ICD-10-CM

## 2022-08-10 MED ORDER — LISINOPRIL 10 MG PO TABS: 10.0000 mg | ORAL_TABLET | Freq: Every day | ORAL | 3 refills | Status: DC

## 2022-08-10 MED ORDER — ATORVASTATIN CALCIUM 20 MG PO TABS: 20.0000 mg | ORAL_TABLET | Freq: Every day | ORAL | 3 refills | Status: DC

## 2022-08-10 MED ORDER — METFORMIN HCL 1000 MG PO TABS: 1000.0000 mg | ORAL_TABLET | Freq: Two times a day (BID) | ORAL | 3 refills | Status: DC

## 2022-08-10 NOTE — Progress Notes (Signed)
Subjective:    Patient ID: [name], female    DOB: 14-Apr-1966, 57 y.o.   MRN: [id number]  56y/o caucasian female established patient current on labs  Last fill metformin 104m po BID, lisinopril 135m9/2023 from [facility].  Had recent office visit with PCM was instructed to changed from simvastatin to atorvastatin.  Hgba1c improved this quarter to 7.2  Feeling well denied concerns.      Review of Systems  Constitutional:  Negative for chills, diaphoresis, fatigue and fever.  HENT:  Negative for rhinorrhea, trouble swallowing and voice change.   Eyes:  Negative for visual disturbance.  Respiratory:  Negative for cough, shortness of breath, wheezing and stridor.   Cardiovascular:  Negative for chest pain and palpitations.  Gastrointestinal:  Negative for diarrhea, nausea and vomiting.  Genitourinary:  Negative for difficulty urinating.  Musculoskeletal:  Negative for myalgias.  Neurological:  Negative for dizziness, tremors, syncope, facial asymmetry, speech difficulty, weakness and headaches.  Psychiatric/Behavioral:  Negative for agitation, confusion and sleep disturbance.        Objective:   Physical Exam Vitals and nursing note reviewed.  Constitutional:      General: She is awake. She is not in acute distress.    Appearance: Normal appearance. She is well-developed and well-groomed. She is not ill-appearing, toxic-appearing or diaphoretic.  HENT:     Head: Normocephalic and atraumatic.     Jaw: There is normal jaw occlusion.     Salivary Glands: Right salivary gland is not diffusely enlarged. Left salivary gland is not diffusely enlarged.     Right Ear: Hearing and external ear normal.     Left Ear: Hearing and external ear normal.     Nose: Nose normal. No congestion or rhinorrhea.     Mouth/Throat:     Lips: Pink. No lesions.     Mouth: Mucous membranes are moist.     Pharynx: Oropharynx is clear.  Eyes:     General: Lids are normal. Vision grossly intact. Gaze  aligned appropriately. No scleral icterus.       Right eye: No discharge.        Left eye: No discharge.     Extraocular Movements: Extraocular movements intact.     Conjunctiva/sclera: Conjunctivae normal.     Pupils: Pupils are equal, round, and reactive to light.  Neck:     Trachea: Trachea normal.  Cardiovascular:     Rate and Rhythm: Normal rate and regular rhythm.     Pulses: Normal pulses.  Pulmonary:     Effort: Pulmonary effort is normal.     Breath sounds: Normal breath sounds and air entry. No stridor or transmitted upper airway sounds. No wheezing.     Comments: Spoke full sentences without difficulty; no cough observed in exam room Abdominal:     Palpations: Abdomen is soft.  Musculoskeletal:        General: Normal range of motion.     Cervical back: Normal range of motion and neck supple.     Right lower leg: No edema.     Left lower leg: No edema.  Lymphadenopathy:     Head:     Right side of head: No submandibular or preauricular adenopathy.     Left side of head: No submandibular or preauricular adenopathy.     Cervical:     Right cervical: No superficial cervical adenopathy.    Left cervical: No superficial cervical adenopathy.  Skin:    General: Skin is warm and dry.  Capillary Refill: Capillary refill takes less than 2 seconds.     Coloration: Skin is not ashen, cyanotic, jaundiced, mottled, pale or sallow.     Findings: No abrasion, bruising, burn, erythema, signs of injury, laceration, lesion, petechiae, rash or wound.  Neurological:     General: No focal deficit present.     Mental Status: She is alert and oriented to person, place, and time. Mental status is at baseline.     GCS: GCS eye subscore is 4. GCS verbal subscore is 5. GCS motor subscore is 6.     Cranial Nerves: Cranial nerves 2-12 are intact. No cranial nerve deficit or facial asymmetry.     Sensory: Sensation is intact.     Motor: Motor function is intact. No weakness, tremor, atrophy,  abnormal muscle tone or seizure activity.     Coordination: Coordination is intact. Coordination normal.     Gait: Gait is intact. Gait normal.     Comments: In/out of chair without difficulty; gait sure and steady in clinic; bilateral hand grasp equal 5/5  Psychiatric:        Attention and Perception: Attention and perception normal.        Mood and Affect: Mood and affect normal.        Speech: Speech normal.        Behavior: Behavior normal. Behavior is cooperative.        Thought Content: Thought content normal.        Cognition and Memory: Cognition and memory normal.        Judgment: Judgment normal.        Latest Reference Range & Units 02/15/21 09:10 08/25/21 09:06 08/25/21 09:16 01/26/22 11:25 04/13/22 09:30 05/09/22 11:53  COMPREHENSIVE METABOLIC PANEL     Rpt !    Sodium 134 - 144 mmol/L 139  140 136    Potassium 3.5 - 5.2 mmol/L 4.6  4.5 4.7    Chloride 96 - 106 mmol/L 103  101 100    CO2 20 - 29 mmol/L    23    Glucose 70 - 99 mg/dL 128 (H)  152 (H) 157 (H)    BUN 6 - 24 mg/dL _0 Creatinine 0.57 - 1.00 mg/dL 0.70  0.64 0.69    Calcium 8.7 - 10.2 mg/dL 9.2  9.5 9.4    BUN/Creatinine Ratio 9 - _1 (H) 16    eGFR >59 mL/min/1.73 101  102 101    Phosphorus 3.0 - 4.3 mg/dL 3.8  4.5 (H)     Alkaline Phosphatase 44 - 121 IU/L 72  66 56    Albumin 3.8 - 4.9 g/dL 4.7  4.6 4.5    Albumin/Globulin Ratio 1.2 - 2.2  2.4 (H)  2.0 1.9    Uric Acid 3.0 - 7.2 mg/dL 5.4  5.6     AST 0 - 40 IU/L _2 ALT 0 - 32 IU/L _3 Total Protein 6.0 - 8.5 g/dL 6.7  6.9 6.9    Total Bilirubin 0.0 - 1.2 mg/dL 0.4  0.5 0.3    GGT 0 - 60 IU/L 15  21     Estimated CHD Risk 0.0 - 1.0 times avg. 0.7  0.7     LDH 119 - 226 IU/L 145  148     Total CHOL/HDL Ratio 0.0 - 4.4 ratio 3.7  3.7 3.1    Cholesterol,  Total 100 - 199 mg/dL 175  192 136    HDL Cholesterol >39 mg/dL 47  52 44    MICROALB/CREAT RATIO 0 - 29 mg/g creat  9      Triglycerides 0 - 149 mg/dL 169 (H)   148 211 (H)    VLDL Cholesterol Cal 5 - 40 mg/dL 29  26 34    LDL Chol Calc (NIH) 0 - 99 mg/dL 99  114 (H) 58    Iron 27 - 159 ug/dL 48  64     Vitamin D, 25-Hydroxy 30.0 - 100.0 ng/mL   24.2 (L) 46.5    Vitamin B12 232 - 1,245 pg/mL   349     Vitamin B6 3.4 - 65.2 ug/L   22.4     Globulin, Total 1.5 - 4.5 g/dL 2.0  2.3 2.4    WBC 3.4 - 10.8 x10E3/uL 5.1  5.6 WILL FOLLOW (P)    RBC 3.77 - 5.28 x10E6/uL 4.58  4.61 WILL FOLLOW (P)    Hemoglobin 11.1 - 15.9 g/dL 13.2  13.3 WILL FOLLOW (P)    HCT 34.0 - 46.6 % 39.8  39.5 WILL FOLLOW (P)    MCV 79 - 97 fL 87  86 WILL FOLLOW (P)    MCH 26.6 - 33.0 pg 28.8  28.9 WILL FOLLOW (P)    MCHC 31.5 - 35.7 g/dL 33.2  33.7 WILL FOLLOW (P)    RDW 11.7 - 15.4 % 13.7  13.1 WILL FOLLOW (P)    Platelets 150 - 450 x10E3/uL 293  275 WILL FOLLOW (P)    Neutrophils Not Estab. % 53  55 WILL FOLLOW (P)    Immature Granulocytes Not Estab. % 0  0 WILL FOLLOW (P)    NEUT# 1.4 - 7.0 x10E3/uL 2.7  3.0 WILL FOLLOW (P)    Lymphocyte # 0.7 - 3.1 x10E3/uL 1.8  2.0 WILL FOLLOW (P)    Monocytes Absolute 0.1 - 0.9 x10E3/uL 0.3  0.4 WILL FOLLOW (P)    Basophils Absolute 0.0 - 0.2 x10E3/uL 0.1  0.0 WILL FOLLOW (P)    Immature Grans (Abs) 0.0 - 0.1 x10E3/uL 0.0  0.0 WILL FOLLOW (P)    Lymphs Not Estab. % 36  35 WILL FOLLOW (P)    Monocytes Not Estab. % 7  7 WILL FOLLOW (P)    Basos Not Estab. % 1  1 WILL FOLLOW (P)    Eos Not Estab. % 3  2 WILL FOLLOW (P)    EOS (ABSOLUTE) 0.0 - 0.4 x10E3/uL 0.2  0.1 WILL FOLLOW (P)    POC Glucose 70 - 99 mg/dl     110 !   Hemoglobin A1C 4.8 - 5.6 % 6.9 (H)  7.7 (H) 7.4 (H)  7.7 (H)  !: Data is abnormal (H): Data is abnormally high (L): Data is abnormally low (P): Preliminary Rpt: View report in Results Review for more information Specimen: Blood  Ref Range & Units 1 d ago  Hemoglobin A1c 4.8 - 5.6 % 7.2 Abnormal   Resulting Agency  [facility]  Specimen Collected: 08/09/22 11:43   Performed by: [facility] Last Resulted: 08/09/22 11:43  Received From: [facility]  Result Received: 08/10/22 12:25  Specimen: Urine  Ref Range & Units 1 d ago Comments  Specific Gravity, UA 1.005 - 1.030 1.013   pH, UA 5.0 - 7.5 5.0   UA Color Yellow Yellow   Urine Appearance Clear Clear   Leukocytes, UA Negative  Negative   Protein, UA Negative/Trace Negative   Glucose, UA Negative Negative   Ketones, UA Negative Negative   UA Blood Negative Negative   Bilirubin, UA Negative Negative   UA Urobilinogen 0.2 - 1.0 mg/dL 0.2   Nitrite, Urine Negative Negative   UA Microscopic  Comment Microscopic not indicated and not performed.  Resulting Agency  [facility]   Narrative Performed by [facility] Performed at:  [facility] [address], [place], [place]  [id number] Lab Director: Rush Farmer MD, Phone:  [phone] Specimen Collected: 08/09/22 12:05   Performed by: Maryan Puls Last Resulted: 08/10/22 06:36  Received From: [facility]  Result Received: 08/10/22 12:25  Specimen: Blood - Entire vein (body structure)  Ref Range & Units 9 mo ago Comments  Vit D, 25-Hydroxy 30.0 - 100.0 ng/mL 82.0 Vitamin D deficiency has been defined by the Agency practice guideline as a level of serum 25-OH vitamin D less than 20 ng/mL (1,2). The Endocrine Society went on to further define vitamin D insufficiency as a level between 21 and 29 ng/mL (2). 1. IOM (Institute of Medicine). 2010. Dietary reference    intakes for calcium and D. [place]: The    [employer]. 2. Holick MF, Binkley Fairburn, Bischoff-Ferrari HA, et al.    Evaluation, treatment, and prevention of vitamin D    deficiency: an Endocrine Society clinical practice    guideline. JCEM. 2011 Jul; 96(7):1911-30.      Assessment & Plan:   A-type II diabetes without insulin use; hyperlipidemia, essential hypertension  P-Filled 90 days supply metformin 1043m po BID #180 RF3, lisinopril 121m po daily #90 RF0, atorvastatin 2072mo daily #90 RF3 from [facility] to patient.  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  Discussed with patient if new abdominal pain, myalgias to notify me as change to atorvastatin from simvastatin.  Avoid dehydration.  Continue her diet and exercise as Hgba1c improved.  Patient verbalized understanding information/instructions, agreed with plan of care and had no further questions at this time.

## 2022-08-28 ENCOUNTER — Telehealth: Payer: Self-pay | Admitting: Registered Nurse

## 2022-08-28 ENCOUNTER — Encounter: Payer: Self-pay | Admitting: Registered Nurse

## 2022-08-28 ENCOUNTER — Ambulatory Visit: Payer: No Typology Code available for payment source | Admitting: Occupational Medicine

## 2022-08-28 VITALS — BP 135/80 | HR 110 | Temp 101.6°F

## 2022-08-28 DIAGNOSIS — U071 COVID-19: Secondary | ICD-10-CM

## 2022-08-28 MED ORDER — SALINE SPRAY 0.65 % NA SOLN: 2.0000 | NASAL | 0 refills | Status: DC

## 2022-08-28 MED ORDER — NIRMATRELVIR/RITONAVIR (PAXLOVID)TABLET: 3.0000 | ORAL_TABLET | Freq: Two times a day (BID) | ORAL | 0 refills | Status: DC

## 2022-08-28 NOTE — Progress Notes (Signed)
Patient reports Headache, feels cold, upper abd pain, no NVD, cough. All started yesterday. Fever today 101.6 same symptoms. No SHOB. COVID test positive today. Educated on leaving work. RTW 1/27 but since weekend will be 1/29. Mask til 09/07/22. Antivirals to be sent to [facility]. If they don't have let her know which [facility] or [facility] that does. Educated on hand washing cleaning surface separating from family while sick. Educated to contact clinic if symptoms worsen or not improving. Educated antiviral can cause upset stomach nausea diarrhea. Patient given tylenol in clinic. Sent home with tylenol ibuprofen, Advil cold flu, saline spray  and cough drops. Educated on each one and when to take them. Notify Hr, Public relations account executive.

## 2022-08-28 NOTE — Telephone Encounter (Signed)
Notified by RN Evlyn Kanner patient home covid test positive today and patient would like antivirals.    5 day quarantine per [facility] recommendations. Day 1 of quarantine was 08/29/2022. Patient to contact clinic staff if vomiting after coughing or unable to tolerate po fluids.  Discussed flu and other viral illnesses circulating in community and some causing GI upset.  If GI upset I have recommended clear fluids then bland diet.  Avoid dairy/spicy, fried and large portions of meat while having nausea.  If vomiting hold po intake x 1 hour.  Then sips clear fluids like broths, ginger ale, power ade, gatorade, pedialyte may advance to soft/bland if no vomiting x 24 hours and appetite returned otherwise hydration main focus. Call me at work from home number if symptoms not improved with plan of care  patient to call if high fever, dehydration, marked weakness, fainting, increased abdominal pain, blood in stool or vomit (red or black).     Review possible Covid symptoms including cough, shortness of breath with exertion or at rest, runny nose, congestion, sinus pain/pressure, sore throat, fever/chills, body aches, fatigue, loss of taste/smell, GI symptoms of nausea/vomiting/diarrhea. Also review same day/emergent eval/ER precautions of dizziness/syncope, confusion, blue tint to lips/face, severe shortness of breath/difficulty breathing/wheezing.    Patient to isolate in own room and if possible use only one bathroom if living with others in home.  Wear mask when out of room to help prevent spread to others in household.  Sanitize high touch surfaces with lysol/chlorox/bleach spray or wipes daily as viruses are known to live on surfaces from 24 hours to days.  Patient does want antivirals.  Patient at higher risk for hospitalization due to age, hypertension, diabetes/prediabetes, hyperlipidemia. Patient is on prescription medications or daily medications. If taking medications [place] medication covid interaction checker  used to verify if any drug interactions.  Patient paxlovid emergency use handout sent to patient electronically along with covid quarantine exitcare handout in my chart.  Discussed how to take paxlovid e.g. 3 pills 2 of nirmatrelvir '150mg'$  and 1 of ritonavir '100mg'$  am/pm x 5 days #20/10 RF0. Discuss lemonade can sometimes help with metallic/plastic taste in mouth (side effect medication).  Discuss most common side effects GI upset and bad taste in mouth.  Use birth control/avoid getting pregnant while on paxlovid and no breastfeeding. Exitcare handouts on covid quarantine/home care sent to patient my chart.  FDA handout on paxlovid sent to patient.  May use salt water gargles and nasal saline 2 sprays each nostril q2h prn congestion/sore throat.  Research has shown it helps to prevent hospitalizations and decrease discomfort.   May use flonase nasal 68mg 1 spray each nostril BID prn rhinitis.  Dayquil and nyquil per manufacturer instructions. Avoid dehydration and drink water to keep urine pale yellow clear and voiding every 2-4 hours while awake. RN Kimrey notified HR team patient to work remote/quarantine through Day 5 RTW estimated Day 6 with strict mask wear through Day 10 and no eating in employee lunch room.  Supervisor notified of excused absence by [employer]

## 2022-08-29 MED ORDER — MOLNUPIRAVIR EUA 200MG CAPSULE: 4.0000 | ORAL_CAPSULE | Freq: Two times a day (BID) | ORAL | 0 refills | Status: AC

## 2022-08-29 NOTE — Telephone Encounter (Signed)
Patient contacted clinic was told insurance would not cover paxlovid cost over $1000 but molnupiravir covered by insurance and available in stock per pharmacist.  Patient asked to switch.  New Rx sent to her pharmacy of choice molnupiravir '200mg'$  sig t4 po BID x 5 days #40 RF0.  FDA handout sent to patient my chart for molnupiravir.

## 2022-08-31 NOTE — Telephone Encounter (Signed)
Phone follow up call prior to coming back to work. Patient Fever free. No NVD. Pt feeling much better. Educated if any further issues to come to the clinic.  Patient's RTW is 1/27. Educated to wear mask till 09/07/22.

## 2022-09-04 ENCOUNTER — Telehealth: Payer: Self-pay | Admitting: Registered Nurse

## 2022-09-04 DIAGNOSIS — U071 COVID-19: Secondary | ICD-10-CM

## 2022-09-05 NOTE — Telephone Encounter (Signed)
Patient has fully recovered from Covid. RTW was 1/29 but called out because husband was sick.

## 2022-09-29 ENCOUNTER — Telehealth: Payer: Self-pay | Admitting: Registered Nurse

## 2022-09-29 ENCOUNTER — Encounter: Payer: Self-pay | Admitting: Registered Nurse

## 2022-09-29 DIAGNOSIS — E875 Hyperkalemia: Secondary | ICD-10-CM

## 2022-09-29 DIAGNOSIS — E1165 Type 2 diabetes mellitus with hyperglycemia: Secondary | ICD-10-CM

## 2022-09-29 DIAGNOSIS — Z0000 Encounter for general adult medical examination without abnormal findings: Secondary | ICD-10-CM

## 2022-09-29 DIAGNOSIS — Z8639 Personal history of other endocrine, nutritional and metabolic disease: Secondary | ICD-10-CM

## 2022-09-29 NOTE — Telephone Encounter (Signed)
Appt scheduled fasting labs 10/05/22 orders placed Hgba1c, female executive panel, vitamin D and microalbumin; has had B6/B12 checked normal in previous couple years.  Last microalbumin 9 08/25/21 due ordered.

## 2022-10-05 ENCOUNTER — Other Ambulatory Visit: Payer: No Typology Code available for payment source | Admitting: Occupational Medicine

## 2022-10-05 DIAGNOSIS — E1165 Type 2 diabetes mellitus with hyperglycemia: Secondary | ICD-10-CM

## 2022-10-05 DIAGNOSIS — Z0000 Encounter for general adult medical examination without abnormal findings: Secondary | ICD-10-CM

## 2022-10-05 NOTE — Progress Notes (Signed)
Lab drawn tolerated well no issues noted.

## 2022-10-07 LAB — CMP12+LP+TP+TSH+6AC+CBC/D/PLT
ALT: 20 IU/L (ref 0–32)
AST: 16 IU/L (ref 0–40)
Albumin/Globulin Ratio: 1.5 (ref 1.2–2.2)
Albumin: 4.3 g/dL (ref 3.8–4.9)
Alkaline Phosphatase: 59 IU/L (ref 44–121)
BUN/Creatinine Ratio: 21 (ref 9–23)
BUN: 16 mg/dL (ref 6–24)
Basophils Absolute: 0.1 10*3/uL (ref 0.0–0.2)
Basos: 1 %
Bilirubin Total: 0.6 mg/dL (ref 0.0–1.2)
Calcium: 9.9 mg/dL (ref 8.7–10.2)
Chloride: 101 mmol/L (ref 96–106)
Chol/HDL Ratio: 3.5 ratio (ref 0.0–4.4)
Cholesterol, Total: 170 mg/dL (ref 100–199)
Creatinine, Ser: 0.78 mg/dL (ref 0.57–1.00)
EOS (ABSOLUTE): 0.2 10*3/uL (ref 0.0–0.4)
Eos: 2 %
Estimated CHD Risk: 0.6 times avg. (ref 0.0–1.0)
Free Thyroxine Index: 1.9 (ref 1.2–4.9)
GGT: 20 IU/L (ref 0–60)
Globulin, Total: 2.8 g/dL (ref 1.5–4.5)
Glucose: 135 mg/dL — ABNORMAL HIGH (ref 70–99)
HDL: 48 mg/dL (ref >39)
Hematocrit: 40 % (ref 34.0–46.6)
Hemoglobin: 12.6 g/dL (ref 11.1–15.9)
Immature Grans (Abs): 0 10*3/uL (ref 0.0–0.1)
Immature Granulocytes: 0 %
Iron: 79 ug/dL (ref 27–159)
LDH: 138 IU/L (ref 119–226)
LDL Chol Calc (NIH): 83 mg/dL (ref 0–99)
Lymphocytes Absolute: 2.3 10*3/uL (ref 0.7–3.1)
Lymphs: 28 %
MCH: 28.1 pg (ref 26.6–33.0)
MCHC: 31.5 g/dL (ref 31.5–35.7)
MCV: 89 fL (ref 79–97)
Monocytes Absolute: 0.6 10*3/uL (ref 0.1–0.9)
Monocytes: 7 %
Neutrophils Absolute: 5.1 10*3/uL (ref 1.4–7.0)
Neutrophils: 62 %
Phosphorus: 4.1 mg/dL (ref 3.0–4.3)
Platelets: 335 10*3/uL (ref 150–450)
Potassium: 5.3 mmol/L — ABNORMAL HIGH (ref 3.5–5.2)
RBC: 4.48 x10E6/uL (ref 3.77–5.28)
RDW: 13.2 % (ref 11.7–15.4)
Sodium: 140 mmol/L (ref 134–144)
T3 Uptake Ratio: 28 % (ref 24–39)
T4, Total: 6.9 ug/dL (ref 4.5–12.0)
TSH: 1.16 u[IU]/mL (ref 0.450–4.500)
Total Protein: 7.1 g/dL (ref 6.0–8.5)
Triglycerides: 237 mg/dL — ABNORMAL HIGH (ref 0–149)
Uric Acid: 6.1 mg/dL (ref 3.0–7.2)
VLDL Cholesterol Cal: 39 mg/dL (ref 5–40)
WBC: 8.2 10*3/uL (ref 3.4–10.8)
eGFR: 87 mL/min/{1.73_m2} (ref >59)

## 2022-10-07 LAB — MICROALBUMIN / CREATININE URINE RATIO
Creatinine, Urine: 66.4 mg/dL
Microalb/Creat Ratio: <5 mg/g creat (ref 0–29)
Microalbumin, Urine: <3 ug/mL

## 2022-10-07 LAB — HEMOGLOBIN A1C
Est. average glucose Bld gHb Est-mCnc: 183 mg/dL
Hgb A1c MFr Bld: 8 % — ABNORMAL HIGH (ref 4.8–5.6)

## 2022-10-07 LAB — VITAMIN D 25 HYDROXY (VIT D DEFICIENCY, FRACTURES): Vit D, 25-Hydroxy: 34.1 ng/mL (ref 30.0–100.0)

## 2022-10-08 ENCOUNTER — Encounter: Payer: Self-pay | Admitting: Registered Nurse

## 2022-10-08 MED ORDER — VITAMIN D3 50 MCG (2000 UT) PO CAPS: 2000.0000 [IU] | ORAL_CAPSULE | Freq: Every day | ORAL | 11 refills | Status: AC

## 2022-10-08 NOTE — Progress Notes (Signed)
My chart message sent to patient  [name],  Please see RN Evlyn Kanner to sign your Be Well paperwork and notify her if you want printed copy of results or results sent to another provider's office.  Blood pressure met 130/62 08/09/22 requirement less than 135/85 and LDL less than 130.  Follow up with PCM elevated blood sugar (spot and Hgba1c 3 month avg), blood pressure and potassium. Are you taking any potassium supplements/eating potassium rich foods?  I recommend hydration with water to keep urine pale yellow clear and voiding every 2-4 hours while awake.  Repeat nonfasting potassium level in 4 weeks please schedule with RN Evlyn Kanner.  Are you having any symptoms hyperkalemia e.g. muscle weakness, palpitations, thirsty, or frequent urination. Is patient using any salt replacements on food?  Or eating a lot of potassium rich foods e.g. leafy green vegetables, fish, white beans, avocados, potatoes, acorn squash, milk, mushrooms, bananas, and cooked tomatoes. The current daily value (%DV) for potassium is 4700 milligrams (mg), recently raised from '3500mg'$  by the FDA.    Your vitamin D level dropped.  Have you been taking the over the counter dose now daily? 1000-2000 units daily?  Recheck level in 12 months.   Exitcare handouts on diabetes, managing hypertension, dash diet, calorie counting for weight loss and elevated potassium sent to your my chart account.  Cholesterol is worsening most likely related to your worsening Hgba1c.  I recommend weight loss, exercise 150 minutes per week; dietary fiber 25 grams per day women; eat whole grains/fruits/vegetables; keep added sugars to less than 125 calories/7 teaspoons for women per American Heart Association; iron, kidney/liver/thyroid function, and complete blood count normal.  No infection or anemia on complete blood count.  I recommend repeat potassium level in 2-4 weeks sooner if you are having muscle cramps or palpitations.  Are you using any salt supplements when  cooking?  Don't forget to see your eye doctor and dentist also once a year.  Ensure you tell them you have diabetes.  Please let us know if you have further questions or concerns.  Sincerely,  Gerarda Fraction NP-C

## 2022-10-08 NOTE — Addendum Note (Signed)
Addended by: Gerarda Fraction A on: 10/08/2022 06:58 PM   Modules accepted: Orders

## 2022-10-09 ENCOUNTER — Ambulatory Visit: Payer: No Typology Code available for payment source | Admitting: Occupational Medicine

## 2022-10-09 DIAGNOSIS — E785 Hyperlipidemia, unspecified: Secondary | ICD-10-CM

## 2022-10-09 DIAGNOSIS — E1165 Type 2 diabetes mellitus with hyperglycemia: Secondary | ICD-10-CM

## 2022-10-09 DIAGNOSIS — I10 Essential (primary) hypertension: Secondary | ICD-10-CM

## 2022-10-09 DIAGNOSIS — E559 Vitamin D deficiency, unspecified: Secondary | ICD-10-CM

## 2022-10-09 DIAGNOSIS — Z0000 Encounter for general adult medical examination without abnormal findings: Secondary | ICD-10-CM

## 2022-10-09 DIAGNOSIS — E875 Hyperkalemia: Secondary | ICD-10-CM

## 2022-10-09 NOTE — Progress Notes (Signed)
Follow up with PCM elevated blood sugar (spot and Hgba1c 3 month avg), blood pressure and potassium. Are you taking any potassium supplements/eating potassium rich foods? Denies Supplement but eats foods. NP recommend hydration with water to keep urine pale yellow clear and voiding every 2-4 hours while awake.  Repeat nonfasting potassium level in 4 weeks scheduled with RN Evlyn Kanner.  Are you having any symptoms hyperkalemia e.g. muscle weakness, palpitations, thirsty, or frequent urination. Muscle cramps.Is patient using any salt replacements on food? NO  Or eating a lot of potassium rich foods e.g. leafy green vegetables, fish, white beans, avocados, potatoes, acorn squash, milk, mushrooms, bananas, and cooked tomatoes. Yes The current daily value (%DV) for potassium is 4700 milligrams (mg), recently raised from '3500mg'$  by the FDA.      Your vitamin D level dropped.  Have you been taking the over the counter dose now daily 2000 units daily? Yes  Recheck level in 12 months.   Exitcare handouts on diabetes, managing hypertension, dash diet, calorie counting for weight loss and elevated potassium sent to your my chart account.  Cholesterol is worsening most likely related to your worsening Hgba1c.  NP  recommend weight loss, exercise 150 minutes per week; dietary fiber 25 grams per day women; eat whole grains/fruits/vegetables; keep added sugars to less than 125 calories/7 teaspoons for women per American Heart Association; iron, kidney/liver/thyroid function, and complete blood count normal.  No infection or anemia on complete blood count.   NP recommend repeat potassium level in 2-4 weeks sooner if you are having muscle cramps or palpitations.    Don't forget to see your eye doctor and dentist also once a year.  Ensure you tell them you have diabetes.    Be well insurance premium discount evaluation:   Patient completed PCM office visit epic reviewed by RN Evlyn Kanner and transcribed. Labs  Tobacco attestation  signed. [facility] ROI formed signed. Forms placed in the chart.   Patient given handouts for [facility] pharmacies and discount drugs list,MyChart, Tele doc setup, Tele doc Behavioral, [place] counseling and [facility] counseling.  What to do for infectious illness protocol. Given handout for list of medications that can be filled at [facility]. Given Clinic hours and Clinic Email.

## 2022-10-11 NOTE — Progress Notes (Signed)
Magnesium level added onto [facility] requisition due to leg cramps 10/11/22.  Not taking any potassium supplements signed Be Well 2025 paperwork 10/10/22  BP 130/62 weight 164lbs BMI 29  4 lb weight gain from 2024 Be Well appt.  RN Evlyn Kanner reviewed results with patient in clinic and made appts for follow up lab draws.  Patient will increase water intake.  Met requirements for 2025 Be Well discount and RN Evlyn Kanner gave discount met paperwork to HR team BP less than 135/85 and LDL less than 130.

## 2022-10-12 LAB — MAGNESIUM: Magnesium: 1.8 mg/dL (ref 1.6–2.3)

## 2022-10-12 LAB — SPECIMEN STATUS REPORT

## 2022-10-18 ENCOUNTER — Other Ambulatory Visit: Payer: No Typology Code available for payment source | Admitting: Occupational Medicine

## 2022-10-18 DIAGNOSIS — E875 Hyperkalemia: Secondary | ICD-10-CM

## 2022-10-18 NOTE — Progress Notes (Signed)
Lab drawn from Left AC tolerated well no issues noted.   

## 2022-10-19 ENCOUNTER — Ambulatory Visit: Payer: No Typology Code available for payment source | Admitting: Occupational Medicine

## 2022-10-19 VITALS — BP 116/75 | HR 71

## 2022-10-19 DIAGNOSIS — E1165 Type 2 diabetes mellitus with hyperglycemia: Secondary | ICD-10-CM

## 2022-10-19 LAB — GLUCOSE, POCT (MANUAL RESULT ENTRY): POC Glucose: 128 mg/dl — AB (ref 70–99)

## 2022-10-19 LAB — POTASSIUM: Potassium: 4.5 mmol/L (ref 3.5–5.2)

## 2022-10-19 NOTE — Progress Notes (Signed)
Potassium normal now updated the patient at this time.

## 2022-10-19 NOTE — Progress Notes (Signed)
Noted nonfasting sample

## 2022-10-28 IMAGING — MG MM DIGITAL SCREENING BILAT W/ TOMO AND CAD
6 of 10 series · 6 of 30 positions shown · non-contrast
Comparison: Previous exam(s).

CLINICAL DATA: Screening.

EXAM:
DIGITAL SCREENING BILATERAL MAMMOGRAM WITH TOMOSYNTHESIS AND CAD
TECHNIQUE: Bilateral screening digital craniocaudal and mediolateral oblique
mammograms were obtained. Bilateral screening digital breast
tomosynthesis was performed. The images were evaluated with
computer-aided detection.

[L CC synth-2D]
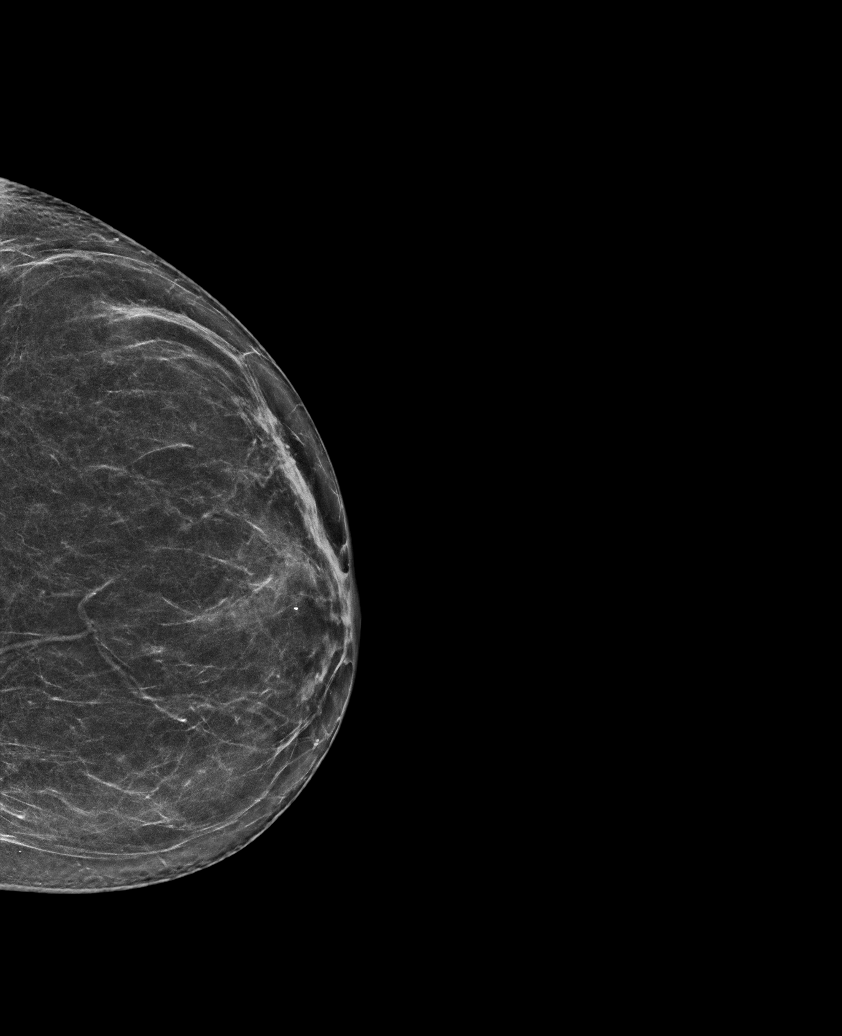

[R CC synth-2D]
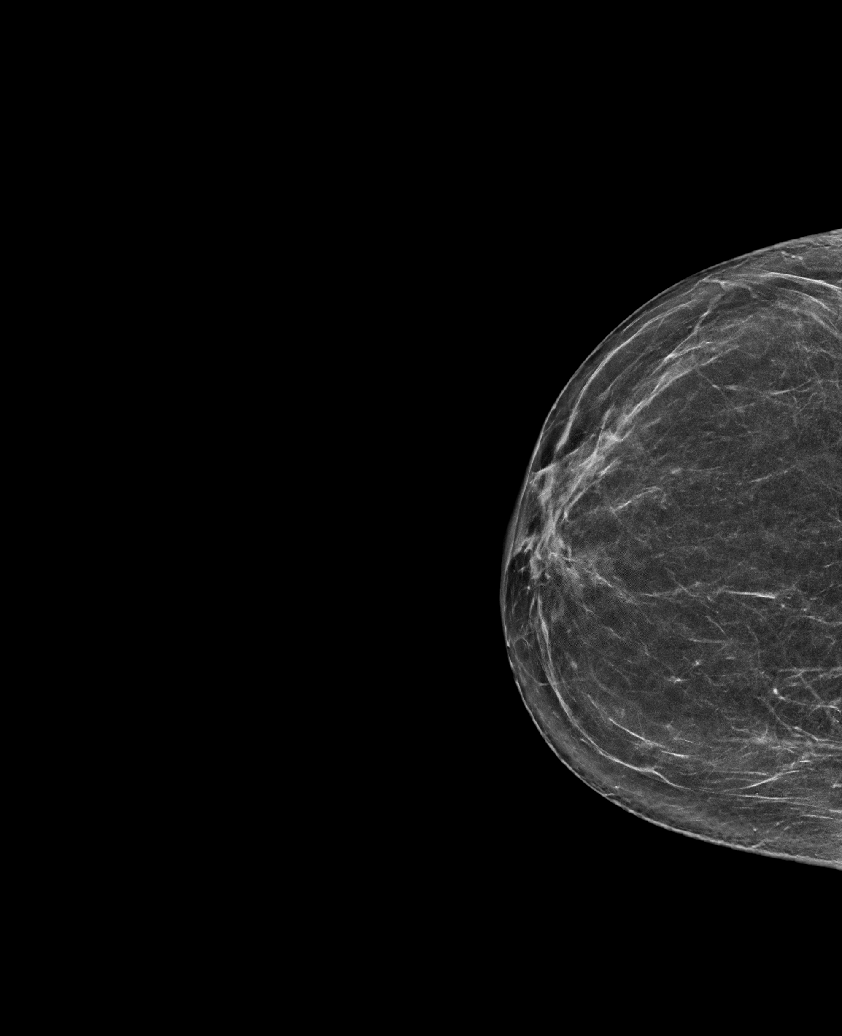

[L MLO synth-2D]
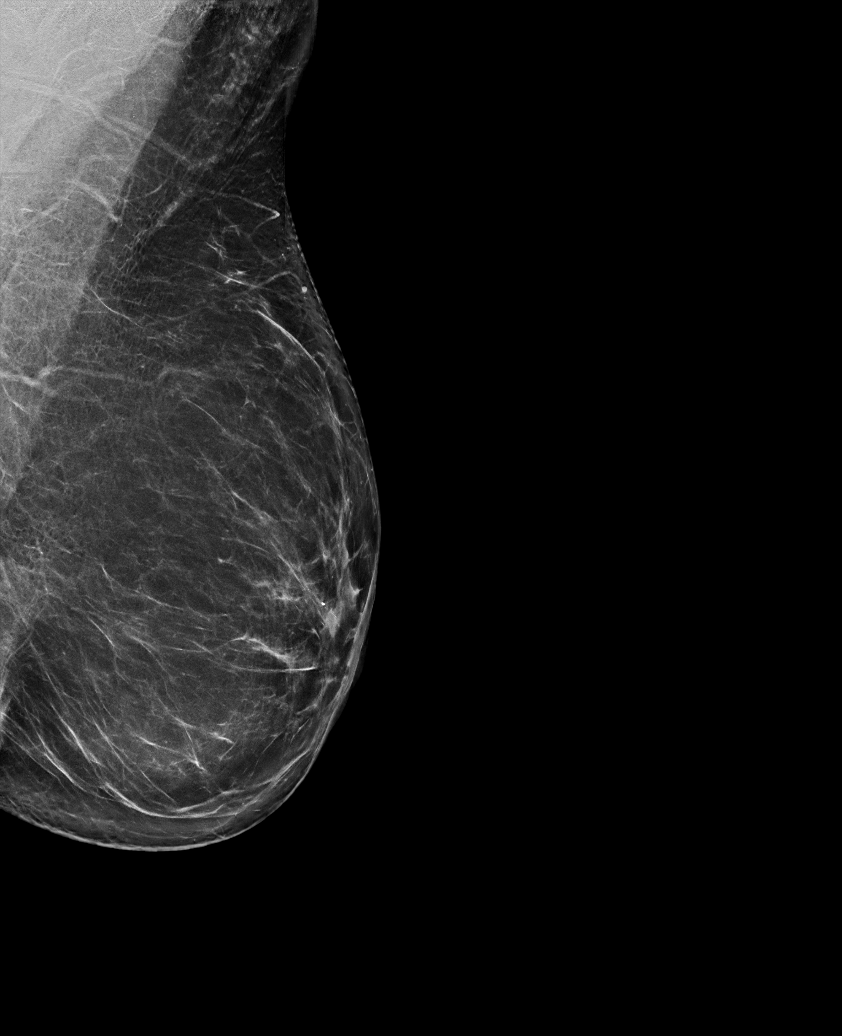

[R XCCL synth-2D]
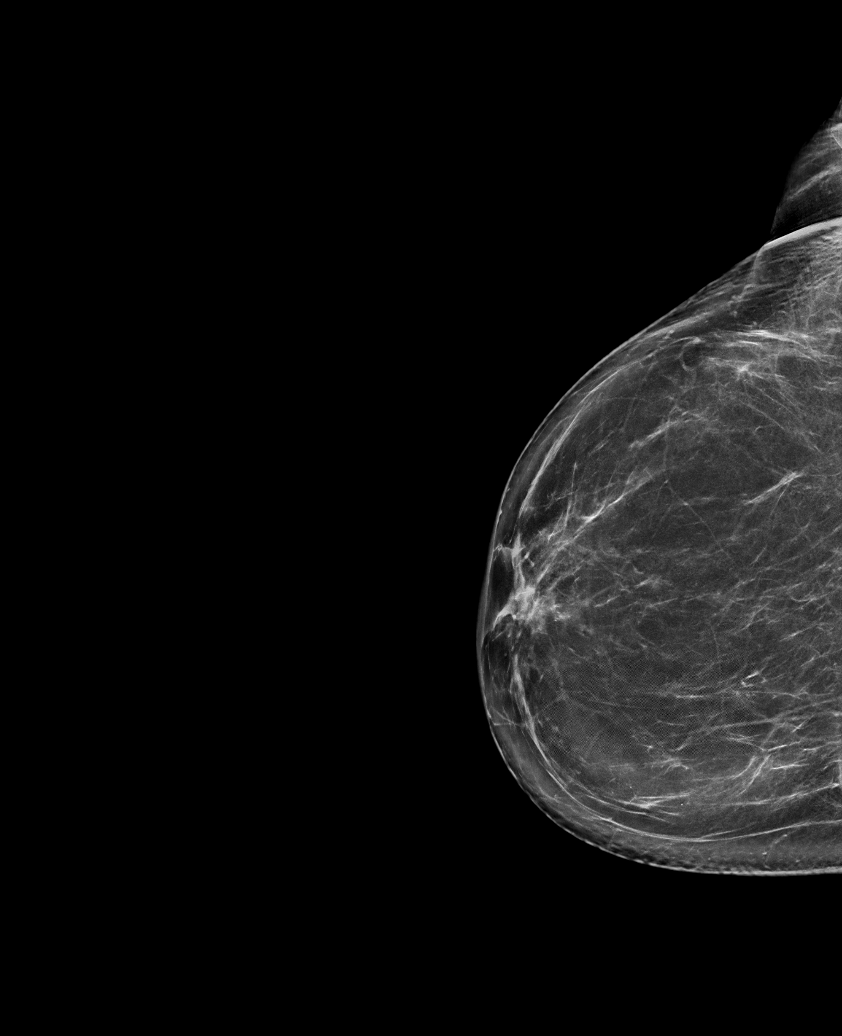

[R MLO synth-2D]
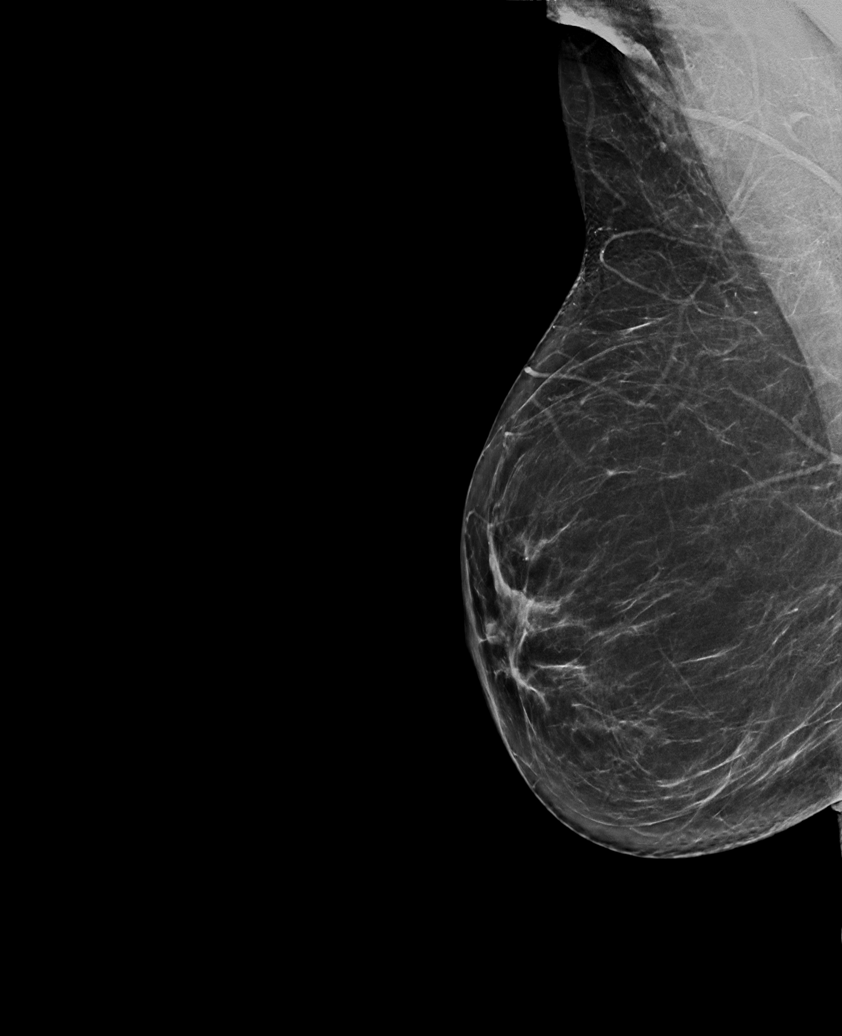

[L CC tomo · tomo slice 35/69.0]
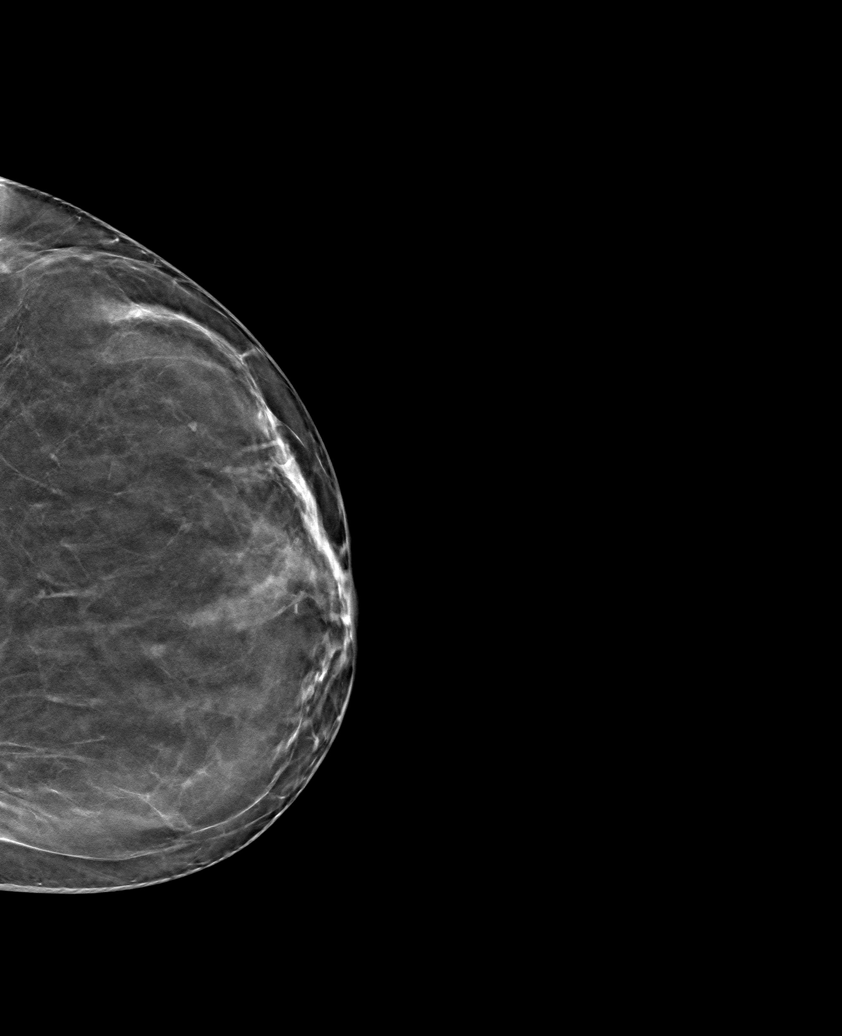

[6 of 30 positions shown; findings below may reference images not displayed]

ACR Breast Density Category b: There are scattered areas of
fibroglandular density.
FINDINGS: There are no findings suspicious for malignancy.
IMPRESSION: No mammographic evidence of malignancy. A result letter of this
screening mammogram will be mailed directly to the patient.

RECOMMENDATION:
Screening mammogram in one year. (Code:51-O-LD2)

BI-RADS CATEGORY  1: Negative.

## 2022-11-16 ENCOUNTER — Telehealth: Payer: Self-pay | Admitting: Registered Nurse

## 2022-11-16 ENCOUNTER — Encounter: Payer: Self-pay | Admitting: Registered Nurse

## 2022-11-16 DIAGNOSIS — I10 Essential (primary) hypertension: Secondary | ICD-10-CM

## 2022-11-16 DIAGNOSIS — E1165 Type 2 diabetes mellitus with hyperglycemia: Secondary | ICD-10-CM

## 2022-11-16 DIAGNOSIS — E785 Hyperlipidemia, unspecified: Secondary | ICD-10-CM

## 2022-11-16 NOTE — Telephone Encounter (Signed)
Patient reported has a couple days remaining on metformin 1000mg  discussed awaiting shipment currently out of stock can bridge with 500mg  or [facility].  Filled atorvastatin 20mg  po daily #90 and lisinopril 10mg  po daily #90 from [facility] to patient today.  Last labs 10/05/22 stable and BP stable.  Patient was initiated on jardiance 11/13/22 by [facility] and to continue metformin 1000mg  po BID.  Patient will follow up next week to pick up metformin 1000mg  tablets once shipment received.  Patient agreed with plan of care and had no further questions at this time.  Latest Reference Range & Units 10/05/22 08:12 10/18/22 09:05  Sodium 134 - 144 mmol/L 140   Potassium 3.5 - 5.2 mmol/L 5.3 (H) 4.5  Chloride 96 - 106 mmol/L 101   Glucose 70 - 99 mg/dL 810 (H)   BUN 6 - 24 mg/dL 16   Creatinine 1.75 - 1.00 mg/dL 1.02   Calcium 8.7 - 58.5 mg/dL 9.9   BUN/Creatinine Ratio 9 - 23  21   eGFR >59 mL/min/1.73 87   Phosphorus 3.0 - 4.3 mg/dL 4.1   Magnesium 1.6 - 2.3 mg/dL 1.8   Alkaline Phosphatase 44 - 121 IU/L 59   Albumin 3.8 - 4.9 g/dL 4.3   Albumin/Globulin Ratio 1.2 - 2.2  1.5   Uric Acid 3.0 - 7.2 mg/dL 6.1   AST 0 - 40 IU/L 16   ALT 0 - 32 IU/L 20   Total Protein 6.0 - 8.5 g/dL 7.1   Total Bilirubin 0.0 - 1.2 mg/dL 0.6   GGT 0 - 60 IU/L 20   Estimated CHD Risk 0.0 - 1.0 times avg. 0.6   LDH 119 - 226 IU/L 138   Total CHOL/HDL Ratio 0.0 - 4.4 ratio 3.5   Cholesterol, Total 100 - 199 mg/dL 277   HDL Cholesterol >82 mg/dL 48   MICROALB/CREAT RATIO 0 - 29 mg/g creat <5   Triglycerides 0 - 149 mg/dL 423 (H)   VLDL Cholesterol Cal 5 - 40 mg/dL 39   LDL Chol Calc (NIH) 0 - 99 mg/dL 83   Iron 27 - 536 ug/dL 79   Vitamin D, 14-ERXVQMG 30.0 - 100.0 ng/mL 34.1   Globulin, Total 1.5 - 4.5 g/dL 2.8   WBC 3.4 - 86.7 Y19J0/DT 8.2   RBC 3.77 - 5.28 x10E6/uL 4.48   Hemoglobin 11.1 - 15.9 g/dL 26.7   HCT 12.4 - 58.0 % 40.0   MCV 79 - 97 fL 89   MCH 26.6 - 33.0 pg 28.1   MCHC 31.5 -  35.7 g/dL 99.8   RDW 33.8 - 25.0 % 13.2   Platelets 150 - 450 x10E3/uL 335   Neutrophils Not Estab. % 62   Immature Granulocytes Not Estab. % 0   NEUT# 1.4 - 7.0 x10E3/uL 5.1   Lymphocyte # 0.7 - 3.1 x10E3/uL 2.3   Monocytes Absolute 0.1 - 0.9 x10E3/uL 0.6   Basophils Absolute 0.0 - 0.2 x10E3/uL 0.1   Immature Grans (Abs) 0.0 - 0.1 x10E3/uL 0.0   Lymphs Not Estab. % 28   Monocytes Not Estab. % 7   Basos Not Estab. % 1   Eos Not Estab. % 2   EOS (ABSOLUTE) 0.0 - 0.4 x10E3/uL 0.2   Hemoglobin A1C 4.8 - 5.6 % 8.0 (H)   Est. average glucose Bld gHb Est-mCnc mg/dL 539   TSH 7.673 - 4.193 uIU/mL 1.160   Thyroxine (T4) 4.5 - 12.0 ug/dL 6.9   Free Thyroxine  Index 1.2 - 4.9  1.9   T3 Uptake Ratio 24 - 39 % 28   (H): Data is abnormally high  Recently had PCM annual visit Blood Pressure 133/72 11/13/2022 8:06 AM EDT    Pulse 73 11/13/2022 8:06 AM EDT    Temperature 36.9 C (98.4 F) 11/13/2022 8:06 AM EDT    Respiratory Rate - -    Oxygen Saturation 99% 11/13/2022 8:06 AM EDT    Inhaled Oxygen Concentration - -    Weight 74.7 kg (164 lb 9.6 oz) 11/13/2022 8:06 AM EDT    Height 160 cm (5\' 3" ) 11/13/2022 8:06 AM EDT    Body Mass Index 29.16 11/13/2022 8:06 AM EDT

## 2022-11-21 NOTE — Telephone Encounter (Signed)
Order [facility] received and dispensed metformin  po BID #180 RF0 to patient today.

## 2023-01-10 ENCOUNTER — Other Ambulatory Visit: Payer: No Typology Code available for payment source | Admitting: Occupational Medicine

## 2023-01-10 DIAGNOSIS — E1165 Type 2 diabetes mellitus with hyperglycemia: Secondary | ICD-10-CM

## 2023-01-10 NOTE — Progress Notes (Signed)
Lab drawn from Left AC tolerated well no issues noted.   

## 2023-01-11 ENCOUNTER — Other Ambulatory Visit: Payer: Self-pay | Admitting: Registered Nurse

## 2023-01-11 DIAGNOSIS — E1165 Type 2 diabetes mellitus with hyperglycemia: Secondary | ICD-10-CM

## 2023-01-11 LAB — HEMOGLOBIN A1C
Est. average glucose Bld gHb Est-mCnc: 166 mg/dL
Hgb A1c MFr Bld: 7.4 % — ABNORMAL HIGH (ref 4.8–5.6)

## 2023-01-21 NOTE — Progress Notes (Signed)
Noted patient has had follow up labs 

## 2023-02-22 ENCOUNTER — Encounter: Payer: Self-pay | Admitting: Registered Nurse

## 2023-02-22 ENCOUNTER — Telehealth: Payer: Self-pay | Admitting: Registered Nurse

## 2023-02-22 ENCOUNTER — Other Ambulatory Visit: Payer: Self-pay | Admitting: Registered Nurse

## 2023-02-22 DIAGNOSIS — E1165 Type 2 diabetes mellitus with hyperglycemia: Secondary | ICD-10-CM

## 2023-02-22 DIAGNOSIS — E785 Hyperlipidemia, unspecified: Secondary | ICD-10-CM

## 2023-02-22 DIAGNOSIS — I10 Essential (primary) hypertension: Secondary | ICD-10-CM

## 2023-02-22 NOTE — Telephone Encounter (Signed)
Patient requested refills metformin 1000mg  po BID #180, lisinopril 10mg  po daily #90 and atorvastatin 20mg  po daily #90.  Metformin #180 last filled 11/06/22; lisinopril 11/16/22 and atorvastatin 11/16/22  Last hgba1c 02/12/23 7.4 at [facility] office jardiance increased from 10 to 25mg  po daily  Last labs Feb stable renal and liver function.  Dispensed 90 day supply from [facility]  to patient today metforming 1000mg  po BID #180, lisinopril 10mg  po daily #90, and atorvastatin 20mg  po daily #90   Latest Reference Range & Units 10/05/22 08:12 10/18/22 09:05   Sodium 134 - 144 mmol/L 140    Potassium 3.5 - 5.2 mmol/L 5.3 (H) 4.5  Chloride 96 - 106 mmol/L 101    Glucose 70 - 99 mg/dL 528 (H)    BUN 6 - 24 mg/dL 16    Creatinine 4.13 - 1.00 mg/dL 2.44    Calcium 8.7 - 01.0 mg/dL 9.9    BUN/Creatinine Ratio 9 - 23  21    eGFR >59 mL/min/1.73 87    Phosphorus 3.0 - 4.3 mg/dL 4.1    Magnesium 1.6 - 2.3 mg/dL 1.8    Alkaline Phosphatase 44 - 121 IU/L 59    Albumin 3.8 - 4.9 g/dL 4.3    Albumin/Globulin Ratio 1.2 - 2.2  1.5    Uric Acid 3.0 - 7.2 mg/dL 6.1    AST 0 - 40 IU/L 16    ALT 0 - 32 IU/L 20    Total Protein 6.0 - 8.5 g/dL 7.1    Total Bilirubin 0.0 - 1.2 mg/dL 0.6    GGT 0 - 60 IU/L 20    Estimated CHD Risk 0.0 - 1.0 times avg. 0.6    LDH 119 - 226 IU/L 138    Total CHOL/HDL Ratio 0.0 - 4.4 ratio 3.5    Cholesterol, Total 100 - 199 mg/dL 272    HDL Cholesterol >39 mg/dL 48    MICROALB/CREAT RATIO 0 - 29 mg/g creat <5    Triglycerides 0 - 149 mg/dL 536 (H)    VLDL Cholesterol Cal 5 - 40 mg/dL 39    LDL Chol Calc (NIH) 0 - 99 mg/dL 83    Iron 27 - 644 ug/dL 79    Vitamin D, 03-KVQQVZD 30.0 - 100.0 ng/mL 34.1    Globulin, Total 1.5 - 4.5 g/dL 2.8    WBC 3.4 - 63.8 V56E3/PI 8.2    RBC 3.77 - 5.28 x10E6/uL 4.48    Hemoglobin 11.1 - 15.9 g/dL 95.1    HCT 88.4 - 16.6 % 40.0    MCV 79 - 97 fL 89    MCH 26.6 - 33.0 pg 28.1    MCHC 31.5 - 35.7 g/dL 06.3    RDW 01.6 - 01.0 % 13.2     Platelets 150 - 450 x10E3/uL 335    Neutrophils Not Estab. % 62    Immature Granulocytes Not Estab. % 0    NEUT# 1.4 - 7.0 x10E3/uL 5.1    Lymphocyte # 0.7 - 3.1 x10E3/uL 2.3    Monocytes Absolute 0.1 - 0.9 x10E3/uL 0.6    Basophils Absolute 0.0 - 0.2 x10E3/uL 0.1    Immature Grans (Abs) 0.0 - 0.1 x10E3/uL 0.0    Lymphs Not Estab. % 28    Monocytes Not Estab. % 7    Basos Not Estab. % 1    Eos Not Estab. % 2    EOS (ABSOLUTE) 0.0 - 0.4 x10E3/uL 0.2    Hemoglobin A1C 4.8 -  5.6 % 8.0 (H)    Est. average glucose Bld gHb Est-mCnc mg/dL 962    TSH 9.528 - 4.132 uIU/mL 1.160    Thyroxine (T4) 4.5 - 12.0 ug/dL 6.9    Free Thyroxine Index 1.2 - 4.9  1.9    T3 Uptake Ratio 24 - 39 % 28    (H): Data is abnormally high   Recently had PCM annual visit Blood Pressure 133/72 11/13/2022 8:06 AM EDT    Pulse 73 11/13/2022 8:06 AM EDT    Temperature 36.9 C (98.4 F) 11/13/2022 8:06 AM EDT    Respiratory Rate - -    Oxygen Saturation 99% 11/13/2022 8:06 AM EDT    Inhaled Oxygen Concentration - -    Weight 74.7 kg (164 lb 9.6 oz) 11/13/2022 8:06 AM EDT    Height 160 cm (5\' 3" ) 11/13/2022 8:06 AM EDT    Body Mass Index 29.16 11/13/2022 8:06 AM EDT

## 2023-04-11 ENCOUNTER — Other Ambulatory Visit: Payer: No Typology Code available for payment source

## 2023-05-22 ENCOUNTER — Telehealth: Payer: Self-pay | Admitting: Registered Nurse

## 2023-05-22 ENCOUNTER — Encounter: Payer: Self-pay | Admitting: Registered Nurse

## 2023-05-22 DIAGNOSIS — I10 Essential (primary) hypertension: Secondary | ICD-10-CM

## 2023-05-22 DIAGNOSIS — E1169 Type 2 diabetes mellitus with other specified complication: Secondary | ICD-10-CM

## 2023-05-22 DIAGNOSIS — E1165 Type 2 diabetes mellitus with hyperglycemia: Secondary | ICD-10-CM

## 2023-05-22 NOTE — Telephone Encounter (Signed)
Patient last filled [facility] metformin 1000mg  po BID #180, lisinopril 10mg  po daily #90 and atorvastatin 20mg  po daily #90 02/22/23.  Had appt with [facility] 05/14/23 metformin changed to XR 500mg  po daily with breakfast discussed with patient I do not have XR on formulary and not able to fill she will need to pick up at [facility] sent by [facility].  Given 90 tabs each atorvastatin and lisinopril from [facility] today.   Last Hgba1c 7.1 at [facility] office 05/14/23 improved from 7.4.  Reminded patient to pick up jardiance also from [facility] as she thought she received from [facility].  Last labs Feb 2024 stable renal and liver function    Latest Reference Range & Units 10/05/22 08:12 10/18/22 09:05    Sodium 134 - 144 mmol/L 140    Potassium 3.5 - 5.2 mmol/L 5.3 (H) 4.5  Chloride 96 - 106 mmol/L 101    Glucose 70 - 99 mg/dL 098 (H)    BUN 6 - 24 mg/dL 16    Creatinine 1.19 - 1.00 mg/dL 1.47    Calcium 8.7 - 82.9 mg/dL 9.9    BUN/Creatinine Ratio 9 - 23  21    eGFR >59 mL/min/1.73 87    Phosphorus 3.0 - 4.3 mg/dL 4.1    Magnesium 1.6 - 2.3 mg/dL 1.8    Alkaline Phosphatase 44 - 121 IU/L 59    Albumin 3.8 - 4.9 g/dL 4.3    Albumin/Globulin Ratio 1.2 - 2.2  1.5    Uric Acid 3.0 - 7.2 mg/dL 6.1    AST 0 - 40 IU/L 16    ALT 0 - 32 IU/L 20    Total Protein 6.0 - 8.5 g/dL 7.1    Total Bilirubin 0.0 - 1.2 mg/dL 0.6    GGT 0 - 60 IU/L 20    Estimated CHD Risk 0.0 - 1.0 times avg. 0.6    LDH 119 - 226 IU/L 138    Total CHOL/HDL Ratio 0.0 - 4.4 ratio 3.5    Cholesterol, Total 100 - 199 mg/dL 562    HDL Cholesterol >39 mg/dL 48    MICROALB/CREAT RATIO 0 - 29 mg/g creat <5    Triglycerides 0 - 149 mg/dL 130 (H)    VLDL Cholesterol Cal 5 - 40 mg/dL 39    LDL Chol Calc (NIH) 0 - 99 mg/dL 83    Iron 27 - 865 ug/dL 79    Vitamin D, 78-IONGEXB 30.0 - 100.0 ng/mL 34.1    Globulin, Total 1.5 - 4.5 g/dL 2.8    WBC 3.4 - 28.4 X32G4/WN 8.2    RBC 3.77 - 5.28 x10E6/uL 4.48    Hemoglobin 11.1 - 15.9 g/dL 02.7    HCT 25.3 - 66.4 % 40.0     MCV 79 - 97 fL 89    MCH 26.6 - 33.0 pg 28.1    MCHC 31.5 - 35.7 g/dL 40.3    RDW 47.4 - 25.9 % 13.2    Platelets 150 - 450 x10E3/uL 335    Neutrophils Not Estab. % 62    Immature Granulocytes Not Estab. % 0    NEUT# 1.4 - 7.0 x10E3/uL 5.1    Lymphocyte # 0.7 - 3.1 x10E3/uL 2.3    Monocytes Absolute 0.1 - 0.9 x10E3/uL 0.6    Basophils Absolute 0.0 - 0.2 x10E3/uL 0.1    Immature Grans (Abs) 0.0 - 0.1 x10E3/uL 0.0    Lymphs Not Estab. % 28    Monocytes Not Estab. % 7  Basos Not Estab. % 1    Eos Not Estab. % 2    EOS (ABSOLUTE) 0.0 - 0.4 x10E3/uL 0.2    Hemoglobin A1C 4.8 - 5.6 % 8.0 (H)    Est. average glucose Bld gHb Est-mCnc mg/dL 409    TSH 8.119 - 1.478 uIU/mL 1.160    Thyroxine (T4) 4.5 - 12.0 ug/dL 6.9    Free Thyroxine Index 1.2 - 4.9  1.9    T3 Uptake Ratio 24 - 39 % 28    (H): Data is abnormally high  Blood Pressure 124/76 05/14/2023 10:44 AM EDT    Pulse 76 05/14/2023 10:44 AM EDT    Temperature 36.3 C (97.3 F) 05/14/2023 10:44 AM EDT    Respiratory Rate 12 05/14/2023 10:44 AM EDT    Oxygen Saturation 98% 05/14/2023 10:44 AM EDT    Inhaled Oxygen Concentration - -    Weight 69.2 kg (152 lb 9.6 oz) 05/14/2023 10:44 AM EDT    Height - -    Body Mass Index 27.03 02/12/2023 8:17 AM EDT

## 2023-07-11 ENCOUNTER — Other Ambulatory Visit: Payer: No Typology Code available for payment source

## 2023-08-06 ENCOUNTER — Encounter: Payer: Self-pay | Admitting: Gastroenterology

## 2023-08-21 ENCOUNTER — Other Ambulatory Visit: Payer: Self-pay | Admitting: Obstetrics and Gynecology

## 2023-08-21 DIAGNOSIS — Z1231 Encounter for screening mammogram for malignant neoplasm of breast: Secondary | ICD-10-CM

## 2023-08-27 ENCOUNTER — Ambulatory Visit: Payer: No Typology Code available for payment source | Admitting: Gastroenterology

## 2023-08-27 ENCOUNTER — Encounter: Payer: Self-pay | Admitting: Gastroenterology

## 2023-08-27 VITALS — BP 130/80 | HR 79 | Ht 63.0 in | Wt 151.0 lb

## 2023-08-27 DIAGNOSIS — K649 Unspecified hemorrhoids: Secondary | ICD-10-CM

## 2023-08-27 DIAGNOSIS — Z860100 Personal history of colon polyps, unspecified: Secondary | ICD-10-CM | POA: Diagnosis not present

## 2023-08-27 DIAGNOSIS — R140 Abdominal distension (gaseous): Secondary | ICD-10-CM

## 2023-08-27 MED ORDER — SUFLAVE 178.7 G PO SOLR: 1.0000 | ORAL | 0 refills | Status: DC

## 2023-08-27 NOTE — Progress Notes (Signed)
Chief Complaint: recall colonoscopy Primary GI MD:Dr. Lavon Paganini  HPI: 58 year old female with medical history as listed below presents for evaluation of recall colonoscopy and bloating.   Discussed the use of AI scribe software for clinical note transcription with the patient, who gave verbal consent to proceed.  History of Present Illness   The patient, accompanied by her son, presents for a routine checkup, with her last colonoscopy having been performed in 2022. She reports experiencing chronic bloating, particularly after consuming certain foods such as sour or acidic items. This bloating is not a new symptom and has been present for a long time.  The patient denies experiencing heartburn or difficulty swallowing. She feels this is likely due to the food in this country as she does not have an issue when she goes home to [place]. No specific food and not every day.  In addition to the bloating, the patient has a long-standing issue with hemorrhoids. She reports that the hemorrhoids protrude after bowel movements but retract without causing bleeding or pain. The patient has had hemorrhoids for a long time, with the condition being noted on her last colonoscopy.  The patient is originally from [place] and moved to the [place] in 1999. She expresses some dissatisfaction with the food in the [place], suggesting it may contribute to her gastrointestinal symptoms. She is open to dietary modifications and probiotic supplementation to manage her bloating.      PREVIOUS GI WORKUP   Colonoscopy 11/2020 - Two 9 to 11 mm polyps in the ascending colon and in the cecum, removed with a cold snare. Resected and retrieved.  - Non- bleeding external and internal hemorrhoids.  - Anal papilla( e) were hypertrophied. - Repeat 3 years  Diagnosis Surgical [P], colon, descending and cecum, polyp (2) - MULTIPLE FRAGMENTS OF SESSILE SERRATED POLYP. - MULTIPLE FRAGMENTS OF HYPERPLASTIC  POLYP. - NO DYSPLASIA OR MALIGNANCY.  Colonoscopy 04/2017 - Two 11 to 15 mm polyps in the transverse colon, removed with a cold snare. Resected and retrieved.  - Diverticulosis in the sigmoid colon, in the ascending colon and in the cecum.  - Non- bleeding internal hemorrhoids.  - Anal papilla( e) were hypertrophied.  Diagnosis Surgical [P], transverse, polyps (2) - SESSILE SERRATED POLYP (FOUR FRAGMENTS). - BENIGN COLONIC MUCOSA (ONE FRAGMENT). - NO DYSPLASIA OR MALIGNANCY.  Past Medical History:  Diagnosis Date   Diabetes mellitus without complication (HCC)    Hyperlipidemia    Thrush, oral 07/04/2019   Tooth infection 07/04/2019    Past Surgical History:  Procedure Laterality Date   COLONOSCOPY  2018   HERNIA REPAIR     POLYPECTOMY     SPINE SURGERY  Disk surgery    Current Outpatient Medications  Medication Sig Dispense Refill   aspirin EC 81 MG tablet Take 81 mg by mouth daily.     atorvastatin (LIPITOR) 20 MG tablet Take 1 tablet (20 mg total) by mouth daily. 90 tablet 3   Cholecalciferol (VITAMIN D3) 50 MCG (2000 UT) CAPS Take 1 capsule (2,000 Units total) by mouth daily. 30 capsule 11   Cholecalciferol 1.25 MG (50000 UT) capsule Take 1 capsule (50,000 Units total) by mouth once a week. 12 capsule 0   empagliflozin (JARDIANCE) 25 MG TABS tablet Take 25 mg by mouth daily.     isosorbide mononitrate (IMDUR) 30 MG 24 hr tablet Take 1 tablet by mouth daily.     lisinopril (ZESTRIL) 10 MG tablet Take 1 tablet by mouth  daily.     metFORMIN (GLUCOPHAGE-XR) 500 MG 24 hr tablet Take 500 mg by mouth daily with breakfast.     metoprolol succinate (TOPROL-XL) 25 MG 24 hr tablet Take 1 tablet by mouth daily.     Blood Glucose Monitoring Suppl (ONETOUCH VERIO) w/Device KIT 1 kit by Does not apply route as directed. 1 kit 0   diclofenac (VOLTAREN) 75 MG EC tablet Take 1 tablet (75 mg total) by mouth daily as needed. 30 tablet 0   glucose blood (ONETOUCH VERIO) test strip Use as  instructed (Patient not taking: Reported on 08/27/2023) 100 each 12   Lancets (ONETOUCH ULTRASOFT) lancets Use as instructed 100 each 12   lisinopril (ZESTRIL) 10 MG tablet Take 1 tablet (10 mg total) by mouth daily. 90 tablet 3   Multiple Vitamins-Minerals (MULTIVITAMIN ADULTS 50+) TABS Take by mouth.     nitroGLYCERIN (NITROSTAT) 0.3 MG SL tablet Place 1 tablet under the tongue as needed. (Patient not taking: Reported on 08/27/2023)     PEG 3350-KCl-NaCl-NaSulf-MgSul (SUFLAVE) 178.7 g SOLR Take 1 kit by mouth as directed. 1 each 0   sodium chloride (OCEAN) 0.65 % SOLN nasal spray Place 2 sprays into both nostrils every 2 (two) hours while awake.  0   No current facility-administered medications for this visit.    Allergies as of 08/27/2023   (Not on File)    Family History  Problem Relation Age of Onset   Diabetes Father    Colon cancer Neg Hx    Liver disease Neg Hx    Esophageal cancer Neg Hx     Social History   Socioeconomic History   Marital status: Married    Spouse name: Not on file   Number of children: 2   Years of education: Not on file   Highest education level: Not on file  Occupational History   Occupation: order processor  Tobacco Use   Smoking status: Never   Smokeless tobacco: Never  Vaping Use   Vaping status: Never Used  Substance and Sexual Activity   Alcohol use: No    Alcohol/week: 0.0 standard drinks of alcohol   Drug use: No   Sexual activity: Not on file  Other Topics Concern   Not on file  Social History Narrative   Not on file   Social Drivers of Health   Financial Resource Strain: Low Risk  (11/13/2022)   Received from [facility]   Overall Financial Resource Strain (CARDIA)    Difficulty of Paying Living Expenses: Not hard at all  Food Insecurity: No Food Insecurity (11/13/2022)   Received from [facility]   Hunger Vital Sign    Worried About Running Out of Food in the Last Year: Never true    Ran Out  of Food in the Last Year: Never true  Transportation Needs: No Transportation Needs (11/13/2022)   Received from [facility], [facility]   PRAPARE - Transportation    Lack of Transportation (Medical): No    Lack of Transportation (Non-Medical): No  Physical Activity: Not on file  Stress: Not on file  Social Connections: Unknown (12/16/2021)   Received from [facility], [facility]   Social Network    Social Network: Not on file  Intimate Partner Violence: Unknown (11/09/2021)   Received from [facility], [facility]   HITS    Physically Hurt: Not on file    Insult or Talk Down To: Not on file    Threaten Physical  Harm: Not on file    Scream or Curse: Not on file    Review of Systems:    Constitutional: No weight loss, fever, chills, weakness or fatigue HEENT: Eyes: No change in vision               Ears, Nose, Throat:  No change in hearing or congestion Skin: No rash or itching Cardiovascular: No chest pain, chest pressure or palpitations   Respiratory: No SOB or cough Gastrointestinal: See HPI and otherwise negative Genitourinary: No dysuria or change in urinary frequency Neurological: No headache, dizziness or syncope Musculoskeletal: No new muscle or joint pain Hematologic: No bleeding or bruising Psychiatric: No history of depression or anxiety    Physical Exam:  Vital signs: BP 130/80   Pulse 79   Ht 5\' 3"  (1.6 m)   Wt 151 lb (68.5 kg)   LMP 05/06/2019   BMI 26.75 kg/m   Constitutional: NAD, Well developed, Well nourished, alert and cooperative Head:  Normocephalic and atraumatic. Eyes:   PEERL, EOMI. No icterus. Conjunctiva pink. Respiratory: Respirations even and unlabored. Lungs clear to auscultation bilaterally.   No wheezes, crackles, or rhonchi.  Cardiovascular:  Regular rate and rhythm. No peripheral edema, cyanosis or pallor.  Gastrointestinal:  Soft, nondistended, nontender. No rebound or guarding. Normal bowel sounds. No appreciable masses or  hepatomegaly. Rectal:  Not performed. Declined by patient Msk:  Symmetrical without gross deformities. Without edema, no deformity or joint abnormality.  Neurologic:  Alert and  oriented x4;  grossly normal neurologically.  Skin:   Dry and intact without significant lesions or rashes. Psychiatric: Oriented to person, place and time. Demonstrates good judgement and reason without abnormal affect or behaviors.    RELEVANT LABS AND IMAGING: CBC    Component Value Date/Time   WBC 8.2 10/05/2022 0812   WBC 5.4 01/08/2016 0923   RBC 4.48 10/05/2022 0812   RBC 4.96 01/08/2016 0923   HGB 12.6 10/05/2022 0812   HCT 40.0 10/05/2022 0812   PLT 335 10/05/2022 0812   MCV 89 10/05/2022 0812   MCH 28.1 10/05/2022 0812   MCH 25.6 (L) 01/08/2016 0923   MCHC 31.5 10/05/2022 0812   MCHC 31.6 (L) 01/08/2016 0923   RDW 13.2 10/05/2022 0812   LYMPHSABS 2.3 10/05/2022 0812   EOSABS 0.2 10/05/2022 0812   BASOSABS 0.1 10/05/2022 0812    CMP     Component Value Date/Time   NA 140 10/05/2022 0812   K 4.5 10/18/2022 0905   CL 101 10/05/2022 0812   CO2 23 01/26/2022 1125   GLUCOSE 135 (H) 10/05/2022 0812   GLUCOSE 144 (H) 01/08/2016 0923   BUN 16 10/05/2022 0812   CREATININE 0.78 10/05/2022 0812   CREATININE 0.68 01/08/2016 0923   CALCIUM 9.9 10/05/2022 0812   PROT 7.1 10/05/2022 0812   ALBUMIN 4.3 10/05/2022 0812   AST 16 10/05/2022 0812   ALT 20 10/05/2022 0812   ALKPHOS 59 10/05/2022 0812   BILITOT 0.6 10/05/2022 0812   GFRNONAA 83 07/15/2020 1231   GFRNONAA >89 02/08/2015 1125   GFRAA 95 07/15/2020 1231   GFRAA >89 02/08/2015 1125     Assessment/Plan:      Bloating Chronic bloating, possibly related to certain foods (sour, acidic). No other GI symptoms reported. --Recommend low FODMAP diet and probiotic (Align or yogurt) to help combat bloating.  Hemorrhoids Chronic, non-bleeding, non-painful hemorrhoids that prolapse after bowel movements. No acute distress. -- assess during  colonoscopy. Set up for hemorrhoid banding post  colonoscopy.  History of colon polyps Due for routine colonoscopy, last performed in 2022. No new GI symptoms. -Schedule colonoscopy with Dr. Lavon Paganini.  - I thoroughly discussed the procedure with the patient (at bedside) to include nature of the procedure, alternatives, benefits, and risks (including but not limited to bleeding, infection, perforation, anesthesia/cardiac pulmonary complications).  Patient verbalized understanding and gave verbal consent to proceed with procedure.      Lara Mulch [facility] 08/27/2023, 3:19 PM  Cc: Maud Deed, [place]

## 2023-08-27 NOTE — Patient Instructions (Addendum)
Align probiotic (over the counter)  You have been scheduled for a colonoscopy. Please follow written instructions given to you at your visit today.   If you use inhalers (even only as needed), please bring them with you on the day of your procedure ___________________________________________ You will receive your bowel preparation through [facility], which ensures the lowest copay and home delivery, with outreach via text or call from an [phone] number. Please respond promptly to avoid rescheduling. If you are interested in alternative options or have any questions please contact them at [phone]  Your Provider Has Sent Your Bowel Prep Regimen To [facility] What to expect. [facility] will contact you to verify your information and collect your copay, if applicable. Enjoy the comfort of your home while we deliver your prescription to you, free of any shipping charges. Fast, FREE delivery or shipping. [facility] accepts all major insurance benefits and applies discounts & coupons  Have additional questions? [facility]'s patient care team is always here to help.  Chat: [email or web address] Call: [phone] Email: [email or web address] NCPDP: [id number] How will we contact you? Welcome Phone call  a Welcome text and a Checkout link in a text Texts you receive from [phone] Are Not Spam.   *To set up delivery, you must complete the checkout process via link or speak to one of our patient care representatives. If we are unable to reach you, your prescription may be delayed.   Due to recent changes in healthcare laws, you may see the results of your imaging and laboratory studies on MyChart before your provider has had a chance to review them.  We understand that in some cases there may be results that are confusing or concerning to you. Not all laboratory results come back in the same time frame and the provider may be waiting for multiple results in order to interpret others.   Please give [place] 48 hours in order for your provider to thoroughly review all the results before contacting the office for clarification of your results.   Thank you for trusting me with your gastrointestinal care!   Boone Master, PA

## 2023-09-04 ENCOUNTER — Other Ambulatory Visit: Payer: No Typology Code available for payment source

## 2023-09-04 DIAGNOSIS — E1165 Type 2 diabetes mellitus with hyperglycemia: Secondary | ICD-10-CM

## 2023-09-04 NOTE — Progress Notes (Signed)
Labs drawn from L antecubital without difficulty.  Pt produced random urine specimen. Sent to [facility].

## 2023-09-05 ENCOUNTER — Encounter: Payer: Self-pay | Admitting: Registered Nurse

## 2023-09-05 ENCOUNTER — Ambulatory Visit
Admission: RE | Admit: 2023-09-05 | Discharge: 2023-09-05 | Disposition: A | Discharge status: Home / Self Care | Payer: No Typology Code available for payment source | Source: Ambulatory Visit

## 2023-09-05 DIAGNOSIS — Z1231 Encounter for screening mammogram for malignant neoplasm of breast: Secondary | ICD-10-CM

## 2023-09-05 LAB — MICROALBUMIN / CREATININE URINE RATIO
Creatinine, Urine: 58.4 mg/dL
Microalb/Creat Ratio: 6 mg/g{creat} (ref 0–29)
Microalbumin, Urine: 3.3 ug/mL

## 2023-09-05 LAB — HGB A1C W/O EAG: Hgb A1c MFr Bld: 7.7 % — ABNORMAL HIGH (ref 4.8–5.6)

## 2023-09-18 ENCOUNTER — Encounter: Payer: Self-pay | Admitting: Gastroenterology

## 2023-09-25 ENCOUNTER — Encounter: Payer: Self-pay | Admitting: Gastroenterology

## 2023-09-25 ENCOUNTER — Ambulatory Visit: Payer: No Typology Code available for payment source | Admitting: Gastroenterology

## 2023-09-25 VITALS — BP 102/57 | HR 85 | Temp 97.4°F | Resp 11 | Ht 63.0 in | Wt 151.0 lb

## 2023-09-25 DIAGNOSIS — Z860100 Personal history of colon polyps, unspecified: Secondary | ICD-10-CM

## 2023-09-25 DIAGNOSIS — D123 Benign neoplasm of transverse colon: Secondary | ICD-10-CM

## 2023-09-25 DIAGNOSIS — K648 Other hemorrhoids: Secondary | ICD-10-CM

## 2023-09-25 DIAGNOSIS — K644 Residual hemorrhoidal skin tags: Secondary | ICD-10-CM

## 2023-09-25 DIAGNOSIS — Z1211 Encounter for screening for malignant neoplasm of colon: Secondary | ICD-10-CM | POA: Diagnosis present

## 2023-09-25 DIAGNOSIS — K5730 Diverticulosis of large intestine without perforation or abscess without bleeding: Secondary | ICD-10-CM | POA: Diagnosis not present

## 2023-09-25 DIAGNOSIS — Z860101 Personal history of adenomatous and serrated colon polyps: Secondary | ICD-10-CM

## 2023-09-25 MED ORDER — SODIUM CHLORIDE 0.9 % IV SOLN: 500.0000 mL | Freq: Once | INTRAVENOUS | Status: DC

## 2023-09-25 NOTE — Progress Notes (Signed)
 Pt's states no medical or surgical changes since previsit or office visit.

## 2023-09-25 NOTE — Patient Instructions (Signed)

## 2023-09-25 NOTE — Progress Notes (Signed)
 [place], gd SR, tolerated procedure well, VSS, report to RN

## 2023-09-25 NOTE — Op Note (Signed)
 [facility] Patient Name: [name] Procedure Date: 09/25/2023 2:47 PM MRN: [id number] Endoscopist: Napoleon Form , MD, [id number] Age: 58 Referring MD:  Date of Birth: 1965-12-08 Gender: Female Account #: [id number] Procedure:                Colonoscopy Indications:              High risk colon cancer surveillance: Personal                            history of adenoma (10 mm or greater in size) Medicines:                Monitored Anesthesia Care Procedure:                Pre-Anesthesia Assessment:                           - Prior to the procedure, a History and Physical                            was performed, and patient medications and                            allergies were reviewed. The patient's tolerance of                            previous anesthesia was also reviewed. The risks                            and benefits of the procedure and the sedation                            options and risks were discussed with the patient.                            All questions were answered, and informed consent                            was obtained. Prior Anticoagulants: The patient has                            taken no anticoagulant or antiplatelet agents. ASA                            Grade Assessment: II - A patient with mild systemic                            disease. After reviewing the risks and benefits,                            the patient was deemed in satisfactory condition to                            undergo the procedure.  After obtaining informed consent, the colonoscope                            was passed under direct vision. Throughout the                            procedure, the patient's blood pressure, pulse, and                            oxygen saturations were monitored continuously. The                            Olympus Scope SN: [id number] was introduced through                            the anus and  advanced to the the cecum, identified                            by appendiceal orifice and ileocecal valve. The                            colonoscopy was performed without difficulty. The                            patient tolerated the procedure well. The quality                            of the bowel preparation was good. The ileocecal                            valve, appendiceal orifice, and rectum were                            photographed. Scope In: 3:01:39 PM Scope Out: 3:17:38 PM Scope Withdrawal Time: 0 hours 10 minutes 19 seconds  Total Procedure Duration: 0 hours 15 minutes 59 seconds  Findings:                 The perianal and digital rectal examinations were                            normal.                           Two sessile polyps were found in the transverse                            colon. The polyps were 5 to 8 mm in size. These                            polyps were removed with a cold snare. Resection                            and retrieval were complete.  Scattered small-mouthed diverticula were found in                            the sigmoid colon and descending colon.                           Non-bleeding external and internal hemorrhoids were                            found during retroflexion. The hemorrhoids were                            medium-sized. Complications:            No immediate complications. Estimated Blood Loss:     Estimated blood loss was minimal. Impression:               - Two 5 to 8 mm polyps in the transverse colon,                            removed with a cold snare. Resected and retrieved.                           - Diverticulosis in the sigmoid colon and in the                            descending colon.                           - Non-bleeding external and internal hemorrhoids. Recommendation:           - Patient has a contact number available for                            emergencies. The  signs and symptoms of potential                            delayed complications were discussed with the                            patient. Return to normal activities tomorrow.                            Written discharge instructions were provided to the                            patient.                           - Resume previous diet.                           - Continue present medications.                           - Await pathology results.                           -  Repeat colonoscopy in 5 years for surveillance                            based on pathology results. Napoleon Form, MD 09/25/2023 3:24:48 PM This report has been signed electronically.

## 2023-09-25 NOTE — Progress Notes (Signed)
 [facility] History and Physical   Primary Care Physician:  Maud Deed, [place]   Reason for Procedure:  History of adenomatous colon polyps  Plan:    Surveillance colonoscopy with possible interventions as needed     HPI: [name] is a very pleasant 58 y.o. female here for surveillance colonoscopy. Denies any nausea, vomiting, abdominal pain, melena or bright red blood per rectum  The risks and benefits as well as alternatives of endoscopic procedure(s) have been discussed and reviewed. All questions answered. The patient agrees to proceed.    Past Medical History:  Diagnosis Date   Diabetes mellitus without complication (HCC)    Hyperlipidemia    Thrush, oral 07/04/2019   Tooth infection 07/04/2019    Past Surgical History:  Procedure Laterality Date   COLONOSCOPY  2018   HERNIA REPAIR     POLYPECTOMY     SPINE SURGERY  Disk surgery    Prior to Admission medications   Medication Sig Start Date End Date Taking? Authorizing Provider  aspirin EC 81 MG tablet Take 81 mg by mouth daily.    [provider]  atorvastatin (LIPITOR) 20 MG tablet Take 1 tablet (20 mg total) by mouth daily. 08/10/22   Betancourt, Jarold Song, NP  Blood Glucose Monitoring Suppl (ONETOUCH VERIO) w/Device KIT 1 kit by Does not apply route as directed. 10/12/17   Betancourt, Jarold Song, NP  Cholecalciferol (VITAMIN D3) 50 MCG (2000 UT) CAPS Take 1 capsule (2,000 Units total) by mouth daily. 10/08/22 10/08/23  Betancourt, Jarold Song, NP  Cholecalciferol 1.25 MG (50000 UT) capsule Take 1 capsule (50,000 Units total) by mouth once a week. 08/26/21   Betancourt, Jarold Song, NP  diclofenac (VOLTAREN) 75 MG EC tablet Take 1 tablet (75 mg total) by mouth daily as needed. 08/02/21   Betancourt, Jarold Song, NP  empagliflozin (JARDIANCE) 25 MG TABS tablet Take 25 mg by mouth daily. 02/12/23   [provider]  glucose blood (ONETOUCH VERIO) test strip Use as instructed Patient not taking: Reported on  08/27/2023 04/13/22   Barbaraann Barthel, NP  isosorbide mononitrate (IMDUR) 30 MG 24 hr tablet Take 1 tablet by mouth daily. 10/28/21   [provider]  Lancets Letta Pate ULTRASOFT) lancets Use as instructed 04/13/22   Betancourt, Jarold Song, NP  lisinopril (ZESTRIL) 10 MG tablet Take 1 tablet by mouth daily. 09/29/21   [provider]  lisinopril (ZESTRIL) 10 MG tablet Take 1 tablet (10 mg total) by mouth daily. 08/10/22 08/10/23  Betancourt, Jarold Song, NP  metFORMIN (GLUCOPHAGE-XR) 500 MG 24 hr tablet Take 500 mg by mouth daily with breakfast. 05/14/23   [provider]  metoprolol succinate (TOPROL-XL) 25 MG 24 hr tablet Take 1 tablet by mouth daily. 10/28/21   [provider]  Multiple Vitamins-Minerals (MULTIVITAMIN ADULTS 50+) TABS Take by mouth.    [provider]  nitroGLYCERIN (NITROSTAT) 0.3 MG SL tablet Place 1 tablet under the tongue as needed. Patient not taking: Reported on 08/27/2023 10/28/21   [provider]  PEG 3350-KCl-NaCl-NaSulf-MgSul (SUFLAVE) 178.7 g SOLR Take 1 kit by mouth as directed. 08/27/23   McMichael, Saddie Benders, PA-C  sodium chloride (OCEAN) 0.65 % SOLN nasal spray Place 2 sprays into both nostrils every 2 (two) hours while awake. 08/28/22 09/27/22  Betancourt, Jarold Song, NP    Current Outpatient Medications  Medication Sig Dispense Refill   aspirin EC 81 MG tablet Take 81 mg by mouth daily.     atorvastatin (LIPITOR) 20  MG tablet Take 1 tablet (20 mg total) by mouth daily. 90 tablet 3   Blood Glucose Monitoring Suppl (ONETOUCH VERIO) w/Device KIT 1 kit by Does not apply route as directed. 1 kit 0   Cholecalciferol (VITAMIN D3) 50 MCG (2000 UT) CAPS Take 1 capsule (2,000 Units total) by mouth daily. 30 capsule 11   Cholecalciferol 1.25 MG (50000 UT) capsule Take 1 capsule (50,000 Units total) by mouth once a week. 12 capsule 0   diclofenac (VOLTAREN) 75 MG EC tablet Take 1 tablet (75 mg total) by mouth daily as needed. 30 tablet 0    empagliflozin (JARDIANCE) 25 MG TABS tablet Take 25 mg by mouth daily.     glucose blood (ONETOUCH VERIO) test strip Use as instructed (Patient not taking: Reported on 08/27/2023) 100 each 12   isosorbide mononitrate (IMDUR) 30 MG 24 hr tablet Take 1 tablet by mouth daily.     Lancets (ONETOUCH ULTRASOFT) lancets Use as instructed 100 each 12   lisinopril (ZESTRIL) 10 MG tablet Take 1 tablet by mouth daily.     lisinopril (ZESTRIL) 10 MG tablet Take 1 tablet (10 mg total) by mouth daily. 90 tablet 3   metFORMIN (GLUCOPHAGE-XR) 500 MG 24 hr tablet Take 500 mg by mouth daily with breakfast.     metoprolol succinate (TOPROL-XL) 25 MG 24 hr tablet Take 1 tablet by mouth daily.     Multiple Vitamins-Minerals (MULTIVITAMIN ADULTS 50+) TABS Take by mouth.     nitroGLYCERIN (NITROSTAT) 0.3 MG SL tablet Place 1 tablet under the tongue as needed. (Patient not taking: Reported on 08/27/2023)     PEG 3350-KCl-NaCl-NaSulf-MgSul (SUFLAVE) 178.7 g SOLR Take 1 kit by mouth as directed. 1 each 0   sodium chloride (OCEAN) 0.65 % SOLN nasal spray Place 2 sprays into both nostrils every 2 (two) hours while awake.  0   No current facility-administered medications for this visit.    Allergies as of 09/25/2023   (Not on File)    Family History  Problem Relation Age of Onset   Diabetes Father    Colon cancer Neg Hx    Liver disease Neg Hx    Esophageal cancer Neg Hx     Social History   Socioeconomic History   Marital status: Married    Spouse name: Not on file   Number of children: 2   Years of education: Not on file   Highest education level: Not on file  Occupational History   Occupation: order processor  Tobacco Use   Smoking status: Never   Smokeless tobacco: Never  Vaping Use   Vaping status: Never Used  Substance and Sexual Activity   Alcohol use: No    Alcohol/week: 0.0 standard drinks of alcohol   Drug use: No   Sexual activity: Not on file  Other Topics Concern   Not on file  Social  History Narrative   Not on file   Social Drivers of Health   Financial Resource Strain: Low Risk  (11/13/2022)   Received from [facility], [facility]   Overall Financial Resource Strain (CARDIA)    Difficulty of Paying Living Expenses: Not hard at all  Food Insecurity: No Food Insecurity (11/13/2022)   Received from [facility], [facility]   Hunger Vital Sign    Worried About Running Out of Food in the Last Year: Never true    Ran Out of Food in the Last Year: Never true  Transportation Needs: No Transportation Needs (11/13/2022)  Received from [employer], [facility]   [facility] - Transportation    Lack of Transportation (Medical): No    Lack of Transportation (Non-Medical): No  Physical Activity: Not on file  Stress: Not on file  Social Connections: Unknown (12/16/2021)   Received from [facility], [facility]   Social Network    Social Network: Not on file  Intimate Partner Violence: Unknown (11/09/2021)   Received from [facility], [facility]   HITS    Physically Hurt: Not on file    Insult or Talk Down To: Not on file    Threaten Physical Harm: Not on file    Scream or Curse: Not on file    Review of Systems:  All other review of systems negative except as mentioned in the HPI.  Physical Exam: Vital signs in last 24 hours: LMP 05/06/2019  General:   Alert, NAD Lungs:  Clear .   Heart:  Regular rate and rhythm Abdomen:  Soft, nontender and nondistended. Neuro/Psych:  Alert and cooperative. Normal mood and affect. A and O x 3  Reviewed labs, radiology imaging, old records and pertinent past GI work up  Patient is appropriate for planned procedure(s) and anesthesia in an ambulatory setting   K. Scherry Ran , MD [phone]

## 2023-09-25 NOTE — Progress Notes (Signed)
 Called to room to assist during endoscopic procedure.  Patient ID and intended procedure confirmed with present staff. Received instructions for my participation in the procedure from the performing physician.

## 2023-09-26 ENCOUNTER — Telehealth: Payer: Self-pay

## 2023-09-26 NOTE — Telephone Encounter (Signed)
 Attempted f/u call. No answer, left VM.

## 2023-09-27 ENCOUNTER — Encounter: Payer: Self-pay | Admitting: Registered Nurse

## 2023-09-27 ENCOUNTER — Telehealth: Payer: Self-pay | Admitting: Registered Nurse

## 2023-09-27 DIAGNOSIS — R12 Heartburn: Secondary | ICD-10-CM

## 2023-09-27 MED ORDER — OMEPRAZOLE 20 MG PO CPDR: 20.0000 mg | DELAYED_RELEASE_CAPSULE | Freq: Every day | ORAL | 0 refills | Status: DC

## 2023-09-27 NOTE — Telephone Encounter (Signed)
 Patient with recurrent heartburn and reflux tums not helping.  Would like treatment for h pylori.  Stated has had unintentional weight loss also.  Recommended appt with PCM/GI for h pylori testing (breath discussed no blood test available at this time) and possibly EGD/CSP.  [employer]care handouts h pylori testing, h pylori infection and GERD printed for patient stated she has difficulty reading in my chart.  Trial 2 weeks omeprazole 20mg  DR po daily with food #90 RF0 dispensed from [facility] to patient. Discussed with patient treatment requires multiple antibiotics which could cause c diff infection so if not needed I do not treat patients without testing.  Many other things can cause heartburn and unintentional weight loss and needs follow up with PCM. Patient stated she will schedule follow up appt with PCM and trial omeprazole.  Patient agreed with plan of care and had no further questions at this time.

## 2023-09-28 LAB — SURGICAL PATHOLOGY

## 2023-10-14 ENCOUNTER — Encounter: Payer: Self-pay | Admitting: Registered Nurse

## 2023-10-14 ENCOUNTER — Telehealth: Payer: Self-pay | Admitting: Registered Nurse

## 2023-10-14 DIAGNOSIS — E1169 Type 2 diabetes mellitus with other specified complication: Secondary | ICD-10-CM

## 2023-10-14 NOTE — Telephone Encounter (Signed)
 Received epic notification Vitamin D order expiring.  Last level normal 34 new national guidelines effective Jun 2024 recommending supplement 1000 units daily by mouth and no further routine level testing.  I recommend discussing with her PCM at next appt.  Message left for patient with above information at work and my chart message.    Patient had Hgba1c drawn by [facility]  Latest Reference Range & Units 09/04/23 02:27  Hemoglobin A1C 4.8 - 5.6 % 7.7 (H)  (H): Data is abnormally high Met requirements for Be Well 2026 insurance discount.   BP 116/68 10/02/2023  BMI 26.18 Weight 147lbs height 5'3"  Patient had negative h pylori test/saw GI for abdomen pain evaluation  CSP completed 09/25/2023 had 2 polyps removed and diverticula noted sigmoid colon and descending colon along with external and internal hemorrhoids.  Pathology report sessile serrated polyp without dysplasia. No further instructions from GI available in epic at this time after pathology finalized.    Patient instructed to call GI office for further instructions regarding when next CSP recommended e.g. timing 3, 5 or 10 years.   GI eval recommended in clinic 09/27/2023 see tcon.  Patient to see RN Olegario Messier after 1 April to sign Be Well 2026 paperwork as not yet received from HR for printing.  Last lipid panel 09/2022 ordered for patient fasting may complete prior to 05 Mar 2024 as part of her Be Well benefit.

## 2023-10-18 ENCOUNTER — Other Ambulatory Visit

## 2023-10-18 DIAGNOSIS — E1165 Type 2 diabetes mellitus with hyperglycemia: Secondary | ICD-10-CM

## 2023-10-18 DIAGNOSIS — E1169 Type 2 diabetes mellitus with other specified complication: Secondary | ICD-10-CM

## 2023-10-19 LAB — LIPID PANEL
Chol/HDL Ratio: 2.9 ratio (ref 0.0–4.4)
Cholesterol, Total: 137 mg/dL (ref 100–199)
HDL: 48 mg/dL (ref >39)
LDL Chol Calc (NIH): 72 mg/dL (ref 0–99)
Triglycerides: 87 mg/dL (ref 0–149)
VLDL Cholesterol Cal: 17 mg/dL (ref 5–40)

## 2023-10-19 LAB — HEMOGLOBIN A1C
Est. average glucose Bld gHb Est-mCnc: 163 mg/dL
Hgb A1c MFr Bld: 7.3 % — ABNORMAL HIGH (ref 4.8–5.6)

## 2023-10-20 ENCOUNTER — Encounter: Payer: Self-pay | Admitting: Registered Nurse

## 2023-10-29 ENCOUNTER — Ambulatory Visit

## 2023-11-07 NOTE — Telephone Encounter (Signed)
 NP signed Be Well paperwork and [employer] form given to HR Tonya 11/06/23  Patient had signed tobacco attestation and Be Well ROI with RN Olegario Messier.  BP 94/56 LDL 72 Hgba1c 7.3 weight 147lbs BMI 24 compared to previous year 113/24/83/8/164lbs and 29 all measures improved congratulated patient.

## 2023-11-12 ENCOUNTER — Encounter: Payer: Self-pay | Admitting: Gastroenterology

## 2023-11-16 ENCOUNTER — Telehealth: Payer: Self-pay | Admitting: Registered Nurse

## 2023-11-16 ENCOUNTER — Encounter: Payer: Self-pay | Admitting: Registered Nurse

## 2023-11-16 DIAGNOSIS — I10 Essential (primary) hypertension: Secondary | ICD-10-CM

## 2023-11-16 DIAGNOSIS — E1169 Type 2 diabetes mellitus with other specified complication: Secondary | ICD-10-CM

## 2023-11-16 MED ORDER — LISINOPRIL 10 MG PO TABS: 10.0000 mg | ORAL_TABLET | Freq: Every day | ORAL | Status: DC

## 2023-11-16 MED ORDER — ATORVASTATIN CALCIUM 20 MG PO TABS: 20.0000 mg | ORAL_TABLET | Freq: Every day | ORAL | Status: DC

## 2023-11-16 NOTE — Telephone Encounter (Signed)
 Patient last filled [facility]dr[facility] 05/22/2023 lisinopril 10mg  po daily #90 and atorvastatin 20mg  po daily #90 02/22/23. Had appt with [facility] 10/02/2023  labs met requirements lab for Be Well insurance discount starting 07 May 2024 patient to sign paperwork with RN Olegario Messier.  Given 90 tabs each atorvastatin and lisinopril from [facility] today. BP PCM 116/68 weight 147lbs BMI 26 height 5'3" per care everywhere  Hgba1c 7.7   Latest Reference Range & Units 10/18/23 09:25  Total CHOL/HDL Ratio 0.0 - 4.4 ratio 2.9  Cholesterol, Total 100 - 199 mg/dL 093  HDL Cholesterol >23 mg/dL 48  Triglycerides 0 - 557 mg/dL 87  VLDL Cholesterol Cal 5 - 40 mg/dL 17  LDL Chol Calc (NIH) 0 - 99 mg/dL 72  Hemoglobin D2K 4.8 - 5.6 % 7.3 (H)  Est. average glucose Bld gHb Est-mCnc mg/dL 025  (H): Data is abnormally high

## 2023-11-18 MED ORDER — SALINE SPRAY 0.65 % NA SOLN: 2.0000 | NASAL | Status: AC

## 2023-11-29 NOTE — Telephone Encounter (Signed)
 Patient signed her Be Well ROI and tobacco attestation 11/27/2023  Nonsmoker/vaper in previous 12 months. [employer] form given to HR Tonya met requirements for insurance discount starting 07 May 2024.

## 2023-12-27 ENCOUNTER — Ambulatory Visit: Payer: No Typology Code available for payment source | Admitting: Gastroenterology

## 2023-12-27 ENCOUNTER — Encounter: Payer: Self-pay | Admitting: Gastroenterology

## 2023-12-27 VITALS — BP 104/62 | HR 83 | Ht 64.0 in | Wt 147.0 lb

## 2023-12-27 DIAGNOSIS — R1013 Epigastric pain: Secondary | ICD-10-CM

## 2023-12-27 DIAGNOSIS — G8929 Other chronic pain: Secondary | ICD-10-CM

## 2023-12-27 DIAGNOSIS — R140 Abdominal distension (gaseous): Secondary | ICD-10-CM | POA: Diagnosis not present

## 2023-12-27 DIAGNOSIS — K649 Unspecified hemorrhoids: Secondary | ICD-10-CM

## 2023-12-27 DIAGNOSIS — R634 Abnormal weight loss: Secondary | ICD-10-CM

## 2023-12-27 DIAGNOSIS — K219 Gastro-esophageal reflux disease without esophagitis: Secondary | ICD-10-CM

## 2023-12-27 DIAGNOSIS — K641 Second degree hemorrhoids: Secondary | ICD-10-CM

## 2023-12-27 MED ORDER — PANTOPRAZOLE SODIUM 40 MG PO TBEC: 40.0000 mg | DELAYED_RELEASE_TABLET | Freq: Every day | ORAL | 3 refills | Status: AC

## 2023-12-27 NOTE — Patient Instructions (Addendum)
 We have sent the following medications to your pharmacy for you to pick up at your convenience: pantoprazole.  You have been scheduled for an endoscopy. Please follow written instructions given to you at your visit today.  If you use inhalers (even only as needed), please bring them with you on the day of your procedure.  If you take any of the following medications, they will need to be adjusted prior to your procedure:   DO NOT TAKE 7 DAYS PRIOR TO TEST- Trulicity (dulaglutide) Ozempic, Wegovy (semaglutide) Mounjaro (tirzepatide) Bydureon Bcise (exanatide extended release)  DO NOT TAKE 1 DAY PRIOR TO YOUR TEST Rybelsus (semaglutide) Adlyxin (lixisenatide) Victoza (liraglutide) Byetta (exanatide) ___________________________________________________________________________     VISIT SUMMARY:  Today, we discussed your symptoms of bloating, weight loss, and discomfort related to hemorrhoids. We reviewed your history and current medications, and we have planned further evaluations and treatments to address your concerns.  YOUR PLAN:  -BLOATING AND EPIGASTRIC DISCOMFORT: Your bloating and stomach discomfort may be related to conditions like celiac disease, gastritis, or an ulcer. We will perform an upper endoscopy (EGD) to investigate these possibilities. In the meantime, you will start taking pantoprazole 40 mg daily before breakfast to help manage your symptoms.  -UNINTENTIONAL WEIGHT LOSS: You have lost about 15 pounds over the past year without trying. We need to find out if this is related to a gastrointestinal issue, which we will evaluate during your upper endoscopy.  -GASTROESOPHAGEAL REFLUX DISEASE (GERD): Your symptoms of heartburn and stomach discomfort are consistent with GERD. We are switching you from as-needed omeprazole  to a daily dose of pantoprazole 40 mg before breakfast to better control your symptoms.  -HEMORRHOIDS: Your hemorrhoids are causing discomfort during  bowel movements. We will schedule a banding procedure, which can be done in the office without anesthesia, to help relieve your symptoms.  INSTRUCTIONS:  Please schedule an appointment for the upper endoscopy (EGD) and the hemorrhoid banding procedure. Take pantoprazole 40 mg daily before breakfast as prescribed. Follow up with us  after the procedures to discuss the results and next steps.

## 2023-12-27 NOTE — Progress Notes (Signed)
 [name]    [id number]    01/30/1966  Primary Care Physician:Howley, Annamaria Kicks, PA  Referring Physician: Benita Bramble, PA No address on file   Chief complaint:  Bloated    History of Present Illness   Colonoscopy 09/25/23   [name] is a 58 year old female with a history of polyps and hemorrhoids who presents with bloating and weight loss.  She experiences bloating, particularly after meals, without specific triggers. Occasional heartburn and mild stomach pain are localized to the epigastric region. No nausea, vomiting, melena, or dysphagia.  She has experienced an unintentional weight loss of approximately 15 pounds over the past year. She occasionally takes Tylenol  for pain relief and has used omeprazole  as needed for her symptoms. No use of ibuprofen  or other NSAIDs.  She has a history of polyps and underwent a recent colonoscopy. She also has a history of hemorrhoids, which are not currently bleeding but cause discomfort during bowel movements.  Colonoscopy 09/25/23 - Two 5 to 8 mm polyps in the transverse colon, removed with a cold snare. Resected and retrieved. - Diverticulosis in the sigmoid colon and in the descending colon. - Non- bleeding external and internal hemorrhoids.     Outpatient Encounter Medications as of 12/27/2023  Medication Sig   aspirin EC 81 MG tablet Take 81 mg by mouth daily.   atorvastatin  (LIPITOR) 20 MG tablet Take 1 tablet (20 mg total) by mouth daily.   Blood Glucose Monitoring Suppl (ONETOUCH VERIO) w/Device KIT 1 kit by Does not apply route as directed.   Cholecalciferol  1.25 MG (50000 UT) capsule Take 1 capsule (50,000 Units total) by mouth once a week.   diclofenac  (VOLTAREN ) 75 MG EC tablet Take 1 tablet (75 mg total) by mouth daily as needed.   empagliflozin (JARDIANCE) 25 MG TABS tablet Take 25 mg by mouth daily.   gabapentin (NEURONTIN) 300 MG capsule Take 1 capsule by mouth 3 (three) times daily.   glucose blood  (ONETOUCH VERIO) test strip Use as instructed   isosorbide mononitrate (IMDUR) 30 MG 24 hr tablet Take 1 tablet by mouth daily.   Lancets (ONETOUCH ULTRASOFT) lancets Use as instructed   lisinopril  (ZESTRIL ) 10 MG tablet Take 1 tablet (10 mg total) by mouth daily.   metFORMIN  (GLUCOPHAGE -XR) 500 MG 24 hr tablet Take 500 mg by mouth daily with breakfast.   metoprolol succinate (TOPROL-XL) 25 MG 24 hr tablet Take 1 tablet by mouth daily.   Multiple Vitamins-Minerals (MULTIVITAMIN ADULTS 50+) TABS Take by mouth.   nitroGLYCERIN (NITROSTAT) 0.3 MG SL tablet Place 1 tablet under the tongue as needed.   omeprazole  (PRILOSEC) 20 MG capsule Take 1 capsule (20 mg total) by mouth daily for 14 days.   sodium chloride  (OCEAN) 0.65 % SOLN nasal spray Place 2 sprays into both nostrils every 2 (two) hours while awake.   [DISCONTINUED] lisinopril  (ZESTRIL ) 10 MG tablet Take 1 tablet by mouth daily.   [DISCONTINUED] PEG 3350-KCl-NaCl-NaSulf-MgSul (SUFLAVE ) 178.7 g SOLR Take 1 kit by mouth as directed.   No facility-administered encounter medications on file as of 12/27/2023.    Allergies as of 12/27/2023   (Not on File)    Past Medical History:  Diagnosis Date   Diabetes mellitus without complication (HCC)    Hyperlipidemia    Thrush, oral 07/04/2019   Tooth infection 07/04/2019    Past Surgical History:  Procedure Laterality Date   COLONOSCOPY  2018   HERNIA REPAIR  POLYPECTOMY     SPINE SURGERY  Disk surgery    Family History  Problem Relation Age of Onset   Diabetes Father    Colon cancer Neg Hx    Liver disease Neg Hx    Esophageal cancer Neg Hx     Social History   Socioeconomic History   Marital status: Married    Spouse name: Not on file   Number of children: 2   Years of education: Not on file   Highest education level: Not on file  Occupational History   Occupation: order processor  Tobacco Use   Smoking status: Never   Smokeless tobacco: Never  Vaping Use   Vaping  status: Never Used  Substance and Sexual Activity   Alcohol use: No    Alcohol/week: 0.0 standard drinks of alcohol   Drug use: No   Sexual activity: Not on file  Other Topics Concern   Not on file  Social History Narrative   Not on file   Social Drivers of Health   Financial Resource Strain: Low Risk  (12/11/2023)   Received from [facility]   Overall Financial Resource Strain (CARDIA)    Difficulty of Paying Living Expenses: Not hard at all  Food Insecurity: No Food Insecurity (12/11/2023)   Received from [facility]   Hunger Vital Sign    Worried About Running Out of Food in the Last Year: Never true    Ran Out of Food in the Last Year: Never true  Transportation Needs: No Transportation Needs (12/11/2023)   [facility] - [facility]    Lack of Transportation (Medical): No    Lack of Transportation (Non-Medical): No  Physical Activity: Insufficiently Active (12/11/2023)   Received from [facility]   Exercise Vital Sign    Days of Exercise per Week: 3 days    Minutes of Exercise per Session: 30 min  Stress: No Stress Concern Present (12/11/2023)   Received from [facility] - Occupational Stress Questionnaire    Feeling of Stress : Not at all  Social Connections: Socially Integrated (12/11/2023)   Received from [facility]   Social Network    How would you rate your social network (family, work, friends)?: Good participation with social networks  Intimate Partner Violence: Not At Risk (12/11/2023)   Received from [facility]   HITS    Over the last 12 months how often did your partner physically hurt you?: Never    Over the last 12 months how often did your partner insult you or talk down to you?: Never    Over the last 12 months how often did your partner threaten you with physical harm?: Never    Over the last 12 months how often did your partner scream or curse at you?: Never      Review of  systems: All other review of systems negative except as mentioned in the HPI.   Physical Exam: Vitals:   12/27/23 1045  BP: 104/62  Pulse: 83  SpO2: 97%   Body mass index is 25.23 kg/m. Gen:      No acute distress HEENT:  sclera anicteric Abd:      soft, non-tender; no palpable masses, no distension Ext:    No edema Neuro: alert and oriented x 3 Psych: normal mood and affect  Data Reviewed:  Reviewed labs, radiology imaging, old records and pertinent past GI work up     Assessment  and Plan Assessment & Plan Bloating and epigastric discomfort Bloating and epigastric discomfort, possibly related to celiac disease, gastritis, or ulcer. Symptoms improve with omeprazole  and worsen postprandially. No endoscopy performed to date. - Order upper endoscopy (EGD) to evaluate for celiac disease, gastritis, or ulcer. - Prescribe pantoprazole 40 mg daily before breakfast.  Unintentional weight loss Unintentional weight loss of approximately 15 pounds over one year without dietary changes or increased physical activity. Further evaluation needed to rule out gastrointestinal causes. - Evaluate for potential gastrointestinal causes during upper endoscopy.  Gastroesophageal reflux disease (GERD) GERD symptoms managed with as-needed omeprazole . Transitioning to daily pantoprazole for improved control. - Prescribe pantoprazole 40 mg daily before breakfast.  Hemorrhoids Small hemorrhoids causing discomfort during bowel movements. No bleeding reported. Interested in banding procedure for symptom relief, which can be performed in-office without anesthesia. - Schedule hemorrhoid banding procedure in the office after obtaining insurance authorization.    The patient was provided an opportunity to ask questions and all were answered. The patient agreed with the plan and demonstrated an understanding of the instructions.  Lorena Rolling , MD    CC: Benita Bramble, [place]

## 2023-12-28 ENCOUNTER — Encounter: Payer: Self-pay | Admitting: Gastroenterology

## 2024-01-17 ENCOUNTER — Encounter: Admitting: Gastroenterology

## 2024-01-28 ENCOUNTER — Ambulatory Visit: Payer: Self-pay | Admitting: Registered Nurse

## 2024-01-28 DIAGNOSIS — E1165 Type 2 diabetes mellitus with hyperglycemia: Secondary | ICD-10-CM

## 2024-02-07 ENCOUNTER — Encounter: Payer: Self-pay | Admitting: Gastroenterology

## 2024-02-07 ENCOUNTER — Ambulatory Visit: Admitting: Gastroenterology

## 2024-02-07 VITALS — BP 105/59 | HR 97 | Temp 97.7°F | Resp 15 | Ht 64.0 in | Wt 147.0 lb

## 2024-02-07 DIAGNOSIS — K219 Gastro-esophageal reflux disease without esophagitis: Secondary | ICD-10-CM

## 2024-02-07 DIAGNOSIS — R1013 Epigastric pain: Secondary | ICD-10-CM

## 2024-02-07 DIAGNOSIS — G8929 Other chronic pain: Secondary | ICD-10-CM

## 2024-02-07 DIAGNOSIS — R109 Unspecified abdominal pain: Secondary | ICD-10-CM | POA: Diagnosis not present

## 2024-02-07 MED ORDER — SODIUM CHLORIDE 0.9 % IV SOLN: 500.0000 mL | Freq: Once | INTRAVENOUS | Status: DC

## 2024-02-07 NOTE — Progress Notes (Signed)
 Report given to PACU, vss

## 2024-02-07 NOTE — Patient Instructions (Addendum)
-   Resume previous diet. - Continue present medications. - Await pathology results. - Return to GI office at the next available appointment.  YOU HAD AN ENDOSCOPIC PROCEDURE TODAY AT THE [facility]:   Refer to the procedure report that was given to you for any specific questions about what was found during the examination.  If the procedure report does not answer your questions, please call your gastroenterologist to clarify.  If you requested that your care partner not be given the details of your procedure findings, then the procedure report has been included in a sealed envelope for you to review at your convenience later.  YOU SHOULD EXPECT: Some feelings of bloating in the abdomen. Passage of more gas than usual.  Walking can help get rid of the air that was put into your GI tract during the procedure and reduce the bloating. If you had a lower endoscopy (such as a colonoscopy or flexible sigmoidoscopy) you may notice spotting of blood in your stool or on the toilet paper. If you underwent a bowel prep for your procedure, you may not have a normal bowel movement for a few days.  Please Note:  You might notice some irritation and congestion in your nose or some drainage.  This is from the oxygen used during your procedure.  There is no need for concern and it should clear up in a day or so.  SYMPTOMS TO REPORT IMMEDIATELY:  Following upper endoscopy (EGD)  Vomiting of blood or coffee ground material  New chest pain or pain under the shoulder blades  Painful or persistently difficult swallowing  New shortness of breath  Fever of 100F or higher  Black, tarry-looking stools  For urgent or emergent issues, a gastroenterologist can be reached at any hour by calling [phone]. Do not use MyChart messaging for urgent concerns.    DIET:  We do recommend a small meal at first, but then you may proceed to your regular diet.  Drink plenty of fluids but you should avoid alcoholic  beverages for 24 hours.  ACTIVITY:  You should plan to take it easy for the rest of today and you should NOT DRIVE or use heavy machinery until tomorrow (because of the sedation medicines used during the test).    FOLLOW UP: Our staff will call the number listed on your records the next business day following your procedure.  We will call around 7:15- 8:00 am to check on you and address any questions or concerns that you may have regarding the information given to you following your procedure. If we do not reach you, we will leave a message.     If any biopsies were taken you will be contacted by phone or by letter within the next 1-3 weeks.  Please call us  at [phone] if you have not heard about the biopsies in 3 weeks.    SIGNATURES/CONFIDENTIALITY: You and/or your care partner have signed paperwork which will be entered into your electronic medical record.  These signatures attest to the fact that that the information above on your After Visit Summary has been reviewed and is understood.  Full responsibility of the confidentiality of this discharge information lies with you and/or your care-partner.

## 2024-02-07 NOTE — Progress Notes (Signed)
I have reviewed the patient's medical history in detail and updated the computerized patient record.

## 2024-02-07 NOTE — Progress Notes (Signed)
 [facility] History and Physical   Primary Care Physician:  Rena Luke POUR, MD   Reason for Procedure:  Chronic epigastric pain  Plan:    EGD with possible interventions as needed     HPI: [name] is a very pleasant 58 y.o. female here for EGD for evaluation of epigastric abdominal pain.   The risks and benefits as well as alternatives of endoscopic procedure(s) have been discussed and reviewed. All questions answered. The patient agrees to proceed.    Past Medical History:  Diagnosis Date   Diabetes mellitus without complication (HCC)    Hyperlipidemia    Hypertension    Thrush, oral 07/04/2019   Tooth infection 07/04/2019    Past Surgical History:  Procedure Laterality Date   COLONOSCOPY  2018   HERNIA REPAIR     POLYPECTOMY     SPINE SURGERY  Disk surgery    Prior to Admission medications   Medication Sig Start Date End Date Taking? Authorizing Provider  aspirin EC 81 MG tablet Take 81 mg by mouth daily.   Yes [provider]  atorvastatin  (LIPITOR) 20 MG tablet Take 1 tablet (20 mg total) by mouth daily. 11/16/23  Yes Betancourt, Ellouise LABOR, NP  Blood Glucose Monitoring Suppl (ONETOUCH VERIO) w/Device KIT 1 kit by Does not apply route as directed. 10/12/17  Yes Betancourt, Ellouise LABOR, NP  Cholecalciferol  1.25 MG (50000 UT) capsule Take 1 capsule (50,000 Units total) by mouth once a week. 08/26/21  Yes Betancourt, Ellouise LABOR, NP  clotrimazole-betamethasone (LOTRISONE) cream Apply 1 Application topically 2 (two) times daily. 10/06/23  Yes [provider]  diclofenac  (VOLTAREN ) 75 MG EC tablet Take 1 tablet (75 mg total) by mouth daily as needed. 08/02/21  Yes Betancourt, Ellouise LABOR, NP  empagliflozin (JARDIANCE) 25 MG TABS tablet Take 25 mg by mouth daily. 02/12/23  Yes [provider]  gabapentin (NEURONTIN) 300 MG capsule Take 1 capsule by mouth 3 (three) times daily. 11/13/22  Yes [provider]  glucose blood (ONETOUCH VERIO) test  strip Use as instructed 04/13/22  Yes Betancourt, Ellouise A, NP  isosorbide mononitrate (IMDUR) 30 MG 24 hr tablet Take 1 tablet by mouth daily. 10/28/21  Yes [provider]  Lancets JANETT ULTRASOFT) lancets Use as instructed 04/13/22  Yes Betancourt, Ellouise LABOR, NP  lisinopril  (ZESTRIL ) 10 MG tablet Take 1 tablet (10 mg total) by mouth daily. 11/16/23 11/15/24 Yes Betancourt, Ellouise LABOR, NP  metFORMIN  (GLUCOPHAGE -XR) 500 MG 24 hr tablet Take 500 mg by mouth daily with breakfast. 05/14/23  Yes [provider]  metoprolol succinate (TOPROL-XL) 25 MG 24 hr tablet Take 1 tablet by mouth daily. 10/28/21  Yes [provider]  Multiple Vitamins-Minerals (MULTIVITAMIN ADULTS 50+) TABS Take by mouth.   Yes [provider]  pantoprazole  (PROTONIX ) 40 MG tablet Take 1 tablet (40 mg total) by mouth daily. 12/27/23  Yes July Linam V, MD  nitroGLYCERIN (NITROSTAT) 0.3 MG SL tablet Place 1 tablet under the tongue as needed. 10/28/21   [provider]  sodium chloride  (OCEAN) 0.65 % SOLN nasal spray Place 2 sprays into both nostrils every 2 (two) hours while awake. 11/18/23 12/18/23  Betancourt, Ellouise LABOR, NP    Current Outpatient Medications  Medication Sig Dispense Refill   aspirin EC 81 MG tablet Take 81 mg by mouth daily.     atorvastatin  (LIPITOR) 20 MG tablet Take 1 tablet (20 mg total) by mouth daily.     Blood Glucose Monitoring Suppl [facility]  VERIO) w/Device KIT 1 kit by Does not apply route as directed. 1 kit 0   Cholecalciferol  1.25 MG (50000 UT) capsule Take 1 capsule (50,000 Units total) by mouth once a week. 12 capsule 0   clotrimazole-betamethasone (LOTRISONE) cream Apply 1 Application topically 2 (two) times daily.     diclofenac  (VOLTAREN ) 75 MG EC tablet Take 1 tablet (75 mg total) by mouth daily as needed. 30 tablet 0   empagliflozin (JARDIANCE) 25 MG TABS tablet Take 25 mg by mouth daily.     gabapentin (NEURONTIN) 300 MG capsule Take 1 capsule by mouth 3  (three) times daily.     glucose blood (ONETOUCH VERIO) test strip Use as instructed 100 each 12   isosorbide mononitrate (IMDUR) 30 MG 24 hr tablet Take 1 tablet by mouth daily.     Lancets (ONETOUCH ULTRASOFT) lancets Use as instructed 100 each 12   lisinopril  (ZESTRIL ) 10 MG tablet Take 1 tablet (10 mg total) by mouth daily.     metFORMIN  (GLUCOPHAGE -XR) 500 MG 24 hr tablet Take 500 mg by mouth daily with breakfast.     metoprolol succinate (TOPROL-XL) 25 MG 24 hr tablet Take 1 tablet by mouth daily.     Multiple Vitamins-Minerals (MULTIVITAMIN ADULTS 50+) TABS Take by mouth.     pantoprazole  (PROTONIX ) 40 MG tablet Take 1 tablet (40 mg total) by mouth daily. 90 tablet 3   nitroGLYCERIN (NITROSTAT) 0.3 MG SL tablet Place 1 tablet under the tongue as needed.     sodium chloride  (OCEAN) 0.65 % SOLN nasal spray Place 2 sprays into both nostrils every 2 (two) hours while awake.     Current Facility-Administered Medications  Medication Dose Route Frequency Provider Last Rate Last Admin   0.9 %  sodium chloride  infusion  500 mL Intravenous Once Gresham Caetano V, MD        Allergies as of 02/07/2024   (Not on File)    Family History  Problem Relation Age of Onset   Diabetes Father    Colon cancer Neg Hx    Liver disease Neg Hx    Esophageal cancer Neg Hx     Social History   Socioeconomic History   Marital status: Married    Spouse name: Not on file   Number of children: 2   Years of education: Not on file   Highest education level: Not on file  Occupational History   Occupation: order processor  Tobacco Use   Smoking status: Never   Smokeless tobacco: Never  Vaping Use   Vaping status: Never Used  Substance and Sexual Activity   Alcohol use: No    Alcohol/week: 0.0 standard drinks of alcohol   Drug use: No   Sexual activity: Not on file  Other Topics Concern   Not on file  Social History Narrative   Not on file   Social Drivers of Health   Financial Resource  Strain: Low Risk  (12/11/2023)   Received from [facility]   Overall Financial Resource Strain (CARDIA)    Difficulty of Paying Living Expenses: Not hard at all  Food Insecurity: No Food Insecurity (12/11/2023)   [facility]   Hunger Vital Sign    Within the past 12 months, you worried that your food would run out before you got the money to buy more.: Never true    Within the past 12 months, the food you bought just didn't last and you didn't have money to get more.: Never true  Transportation Needs: No Transportation Needs (12/11/2023)   Received from [facility]   PRAPARE - Transportation    Lack of Transportation (Medical): No    Lack of Transportation (Non-Medical): No  Physical Activity: Insufficiently Active (12/11/2023)   Received from [facility]   Exercise Vital Sign    On average, how many days per week do you engage in moderate to strenuous exercise (like a brisk walk)?: 3 days    On average, how many minutes do you engage in exercise at this level?: 30 min  Stress: No Stress Concern Present (12/11/2023)   Received from [facility] - Occupational Stress Questionnaire    Feeling of Stress : Not at all  Social Connections: Socially Integrated (12/11/2023)   Received from [facility]   Social Network    How would you rate your social network (family, work, friends)?: Good participation with social networks  Intimate Partner Violence: Not At Risk (12/11/2023)   Received from [facility]   HITS    Over the last 12 months how often did your partner physically hurt you?: Never    Over the last 12 months how often did your partner insult you or talk down to you?: Never    Over the last 12 months how often did your partner threaten you with physical harm?: Never    Over the last 12 months how often did your partner scream or curse at you?: Never    Review of Systems:  All other review of systems negative except as  mentioned in the HPI.  Physical Exam: Vital signs in last 24 hours: BP 121/82   Pulse 67   Temp 97.7 F (36.5 C) (Temporal)   Resp 15   Ht 5' 4 (1.626 m)   Wt 147 lb (66.7 kg)   LMP 05/06/2019   SpO2 97%   BMI 25.23 kg/m  General:   Alert, NAD Lungs:  Clear .   Heart:  Regular rate and rhythm Abdomen:  Soft, nontender and nondistended. Neuro/Psych:  Alert and cooperative. Normal mood and affect. A and O x 3  Reviewed labs, radiology imaging, old records and pertinent past GI work up  Patient is appropriate for planned procedure(s) and anesthesia in an ambulatory setting   K. Veena Kenry Daubert , MD [phone]

## 2024-02-07 NOTE — Progress Notes (Signed)
 Called to room to assist during endoscopic procedure.  Patient ID and intended procedure confirmed with present staff. Received instructions for my participation in the procedure from the performing physician.

## 2024-02-07 NOTE — Progress Notes (Signed)
1006 Robinul 0.1 mg IV given due large amount of secretions upon assessment.  MD made aware, vss

## 2024-02-07 NOTE — Op Note (Signed)
 [facility] Patient Name: [name] Procedure Date: 02/07/2024 9:56 AM MRN: [id number] Endoscopist: Gustav ALONSO Mcgee , MD, [id number] Age: 58 Referring MD:  Date of Birth: 24-Jun-1966 Gender: Female Account #: [id number] Procedure:                Upper GI endoscopy Indications:              Epigastric abdominal pain Medicines:                Monitored Anesthesia Care Procedure:                Pre-Anesthesia Assessment:                           - Prior to the procedure, a History and Physical                            was performed, and patient medications and                            allergies were reviewed. The patient's tolerance of                            previous anesthesia was also reviewed. The risks                            and benefits of the procedure and the sedation                            options and risks were discussed with the patient.                            All questions were answered, and informed consent                            was obtained. Prior Anticoagulants: The patient has                            taken no anticoagulant or antiplatelet agents. ASA                            Grade Assessment: II - A patient with mild systemic                            disease. After reviewing the risks and benefits,                            the patient was deemed in satisfactory condition to                            undergo the procedure.                           After obtaining informed consent, the endoscope was  passed under direct vision. Throughout the                            procedure, the patient's blood pressure, pulse, and                            oxygen saturations were monitored continuously. The                            Olympus Scope SN [id number] was introduced through the                            mouth, and advanced to the second part of duodenum.                            The upper GI  endoscopy was accomplished without                            difficulty. The patient tolerated the procedure                            well. Scope In: Scope Out: Findings:                 The Z-line was regular and was found 36 cm from the                            incisors.                           The esophagus was normal.                           The stomach was normal.                           The cardia and gastric fundus were normal on                            retroflexion.                           Flattening was found in the first portion of the                            duodenum and flattening was found in the second                            portion of the duodenum. Biopsies for histology                            were taken with a cold forceps for evaluation of                            celiac disease. Complications:  No immediate complications. Estimated Blood Loss:     Estimated blood loss was minimal. Impression:               - Z-line regular, 36 cm from the incisors.                           - Normal esophagus.                           - Normal stomach.                           - Duodenal mucosal changes seen, suspicious for                            celiac disease. Biopsied. Recommendation:           - Patient has a contact number available for                            emergencies. The signs and symptoms of potential                            delayed complications were discussed with the                            patient. Return to normal activities tomorrow.                            Written discharge instructions were provided to the                            patient.                           - Resume previous diet.                           - Continue present medications.                           - Await pathology results.                           - Return to GI office at the next available                             appointment. Moesha Sarchet V. Talullah Abate, MD 02/07/2024 10:32:24 AM This report has been signed electronically.

## 2024-02-11 ENCOUNTER — Telehealth: Payer: Self-pay

## 2024-02-11 NOTE — Telephone Encounter (Signed)
  Follow up Call-     02/07/2024    9:05 AM 09/25/2023    2:10 PM  Call back number  Post procedure Call Back phone  # [phone]  Permission to leave phone message Yes Yes    Attempted to call patient regarding follow-up. No answer, VM left.

## 2024-02-13 LAB — SURGICAL PATHOLOGY

## 2024-02-21 ENCOUNTER — Encounter: Payer: Self-pay | Admitting: Registered Nurse

## 2024-02-21 ENCOUNTER — Telehealth: Payer: Self-pay | Admitting: Registered Nurse

## 2024-02-21 DIAGNOSIS — E1169 Type 2 diabetes mellitus with other specified complication: Secondary | ICD-10-CM

## 2024-02-21 DIAGNOSIS — I10 Essential (primary) hypertension: Secondary | ICD-10-CM

## 2024-02-21 MED ORDER — LISINOPRIL 10 MG PO TABS: 10.0000 mg | ORAL_TABLET | Freq: Every day | ORAL | 3 refills | Status: AC

## 2024-02-21 MED ORDER — ATORVASTATIN CALCIUM 20 MG PO TABS: 20.0000 mg | ORAL_TABLET | Freq: Every day | ORAL | 3 refills | Status: DC

## 2024-02-21 NOTE — Telephone Encounter (Signed)
 Patient requested [facility] refill lisinopril  10mg  po daily #90 RF3 and atorvastatin  20mg  po daily #90 RF3 last filled 11/16/2023 90 tabs each.  Last labs   Latest Reference Range & Units 10/18/23 09:25  Total CHOL/HDL Ratio 0.0 - 4.4 ratio 2.9  Cholesterol, Total 100 - 199 mg/dL 862  HDL Cholesterol >60 mg/dL 48  Triglycerides 0 - 850 mg/dL 87  VLDL Cholesterol Cal 5 - 40 mg/dL 17  LDL Chol Calc (NIH) 0 - 99 mg/dL 72  Hemoglobin J8R 4.8 - 5.6 % 7.3 (H)  Est. average glucose Bld gHb Est-mCnc mg/dL 836  (H): Data is abnormally high Component Ref Range & Units 4 mo ago  Glucose 70 - 99 mg/dL 856 High   BUN 8 - 27 mg/dL 16  Creatinine 9.42 - 8.99 mg/dL 9.33  eGFR >40 fO/fpw/8.26 100  BUN/Creatinine Ratio 12 - 28 24  Sodium 134 - 144 mmol/L 141  Potassium 3.5 - 5.2 mmol/L 4.5  Chloride 96 - 106 mmol/L 101  CALCIUM  8.7 - 10.3 mg/dL 89.9  Total Protein 6.0 - 8.5 g/dL 7.0  Albumin, Serum 3.8 - 4.9 g/dL 4.9  Globulin, Total 1.5 - 4.5 g/dL 2.1  Total Bilirubin 0.0 - 1.2 mg/dL 0.4  Alkaline Phosphatase 44 - 121 IU/L 67  AST 0 - 40 IU/L 20  Resulting Agency [facility]  Narrative Performed by [employer] Performed at:  [address] [facility] [address] [place], [place]  [id number] Lab Director: Frankey Sas MD, Phone:  [phone] Specimen Collected: 10/02/23 00:00   Performed by: [facility] Last Resulted: 10/03/23 11:35  Received From: [facility]  Result Received: 10/04/23 11:13    Last BP  110/66 12/11/23 PCM Office visit  Next labs due June 2026  Dispensed 90 tabs each lisinopril  10mg  and atorvastatin  20mg  to patient today from [facility].  Patient verbalized understanding information/instructions and had no further questions at this time.

## 2024-03-18 ENCOUNTER — Encounter: Admitting: Gastroenterology

## 2024-04-02 ENCOUNTER — Encounter: Admitting: Gastroenterology

## 2024-04-22 ENCOUNTER — Ambulatory Visit: Payer: Self-pay | Admitting: Registered Nurse

## 2024-04-22 DIAGNOSIS — E1165 Type 2 diabetes mellitus with hyperglycemia: Secondary | ICD-10-CM

## 2024-04-24 ENCOUNTER — Ambulatory Visit

## 2024-04-24 VITALS — BP 100/62

## 2024-04-24 DIAGNOSIS — R42 Dizziness and giddiness: Secondary | ICD-10-CM

## 2024-04-24 NOTE — Progress Notes (Unsigned)
 EE to clinic this am with c/o dizzy or feeling a bit off balance along with leg weakness.  Requests BP and blood sugar checks.  BP 100/62 and blood sugar 143 after eating a yogurt and blueberries this morning.  Took her medications.SABRA Apolinar Sharps, RN, BSN, MPH

## 2024-04-29 ENCOUNTER — Encounter: Payer: Self-pay | Admitting: Registered Nurse

## 2024-04-29 ENCOUNTER — Telehealth: Payer: Self-pay | Admitting: Registered Nurse

## 2024-04-29 DIAGNOSIS — Z8639 Personal history of other endocrine, nutritional and metabolic disease: Secondary | ICD-10-CM

## 2024-04-29 DIAGNOSIS — R5383 Other fatigue: Secondary | ICD-10-CM

## 2024-04-29 NOTE — Telephone Encounter (Signed)
 Latest Reference Range & Units 02/22/16 00:00 03/01/17 08:42 12/28/17 08:35 02/15/21 09:10 08/25/21 09:16 10/05/22 08:12  Iron 27 - 159 ug/dL 86 54 80 48 64 79     Latest Reference Range & Units 08/25/21 09:16 01/26/22 11:25 10/05/22 08:12  Vitamin D , 25-Hydroxy 30.0 - 100.0 ng/mL 24.2 (L) 46.5 34.1  (L): Data is abnormally low  Has been taking her vitamin D  daily but not taking multivitamin or iron and does not cook with cast iron pan  Nonfasting iron and vitamin D  ordered for patient to have drawn with RN Karene later today nonfasting.   Component Ref Range & Units 1 mo ago  Hemoglobin A1c 4.8 - 5.6 % 7.3 Abnormal   Resulting Agency [facility]  Specimen Collected: 03/25/24 14:38   Performed by: [facility] Last Resulted: 03/25/24 14:38  Received From: [facility]  Result Received: 04/01/24 15:12   View Encounter   Recent Data from [facility] Related to POCT A1C Component 03/25/24 12/11/23 09/04/23 05/14/23 02/12/23 11/13/22  Hemoglobin A1c 7.3 Abnormal  7.5 Abnormal  7.7 Abnormal  7.1 Abnormal  7.4 Abnormal  7.5 Abnormal    mponent Ref Range & Units 7 mo ago  WBC 3.4 - 10.8 x10E3/uL 6.9  RBC 3.77 - 5.28 x10E6/uL 4.81  Hemoglobin 11.1 - 15.9 g/dL 86.0  Hematocrit 65.9 - 46.6 % 43.4  MCV 79 - 97 fL 90  MCH 26.6 - 33.0 pg 28.9  MCHC 31.5 - 35.7 g/dL 67.9  RDW 88.2 - 84.5 % 13.3  Platelet Count 150 - 450 x10E3/uL 283  Neutrophils Not Estab. % 56  Lymphs Relative Not Estab. % 33  Monocytes Not Estab. % 7  Eos Relative Not Estab. % 3  Basos Relative Not Estab. % 1  Neutrophils Absolute 1.4 - 7.0 x10E3/uL 3.9  Lymphocytes Absolute 0.7 - 3.1 x10E3/uL 2.3  Monocytes Absolute 0.1 - 0.9 x10E3/uL 0.5  Eosinophils Absolute 0.0 - 0.4 x10E3/uL 0.2  Basophils Absolute 0.0 - 0.2 x10E3/uL 0.1  Immature Granulocytes Not Estab. % 0  Immature Grans (Abs) 0.0 - 0.1 x10E3/uL 0.0  Resulting Agency [facility]  Narrative Performed by  [employer] Performed at:  [address] - [facility] [address] [place], [place]  [id number] Lab Director: Frankey Sas MD, Phone:  [phone] Specimen Collected: 10/02/23 00:00   Performed by: [facility] Last Resulted: 10/03/23 08:36  Received From: [facility]  Result Received: 10/14/23 10:20   Component Ref Range & Units 7 mo ago  Glucose 70 - 99 mg/dL 856 High   BUN 8 - 27 mg/dL 16  Creatinine 9.42 - 8.99 mg/dL 9.33  eGFR >40 fO/fpw/8.26 100  BUN/Creatinine Ratio 12 - 28 24  Sodium 134 - 144 mmol/L 141  Potassium 3.5 - 5.2 mmol/L 4.5  Chloride 96 - 106 mmol/L 101  CALCIUM  8.7 - 10.3 mg/dL 89.9  Total Protein 6.0 - 8.5 g/dL 7.0  Albumin, Serum 3.8 - 4.9 g/dL 4.9  Globulin, Total 1.5 - 4.5 g/dL 2.1  Total Bilirubin 0.0 - 1.2 mg/dL 0.4  Alkaline Phosphatase 44 - 121 IU/L 67  AST 0 - 40 IU/L 20  Resulting Agency [facility]  Narrative Performed by [facility] Performed at:  [address] [facility] [address] [place], [place]  [id number] Lab Director: Frankey Sas MD, Phone:  [phone] Specimen Collected: 10/02/23 00:00   Performed by: [facility] Last Resulted: 10/03/23 11:35  Received From: [facility]  Result Received: 10/04/23 11:13

## 2024-04-30 ENCOUNTER — Ambulatory Visit: Payer: Self-pay | Admitting: Registered Nurse

## 2024-04-30 DIAGNOSIS — Z8639 Personal history of other endocrine, nutritional and metabolic disease: Secondary | ICD-10-CM

## 2024-04-30 DIAGNOSIS — R5383 Other fatigue: Secondary | ICD-10-CM

## 2024-05-01 LAB — IRON AND TIBC
Iron Saturation: 29 % (ref 15–55)
Iron: 84 ug/dL (ref 27–159)
Total Iron Binding Capacity: 292 ug/dL (ref 250–450)
UIBC: 208 ug/dL (ref 131–425)

## 2024-05-01 LAB — VITAMIN D 25 HYDROXY (VIT D DEFICIENCY, FRACTURES): Vit D, 25-Hydroxy: 45.1 ng/mL (ref 30.0–100.0)

## 2024-05-06 NOTE — Telephone Encounter (Signed)
 Patient stated unable to get into her my chart.  Results reviewed in person.  Discussed continue her vitamin D  supplement and can increase her iron rich foods in diet.  Patient reported getting 4 hours sleep per night difficulty falling asleep and staying asleep.  Discussed may start melatonin supplement 2mg  at bedtime.  Discussed sleep hygiene at length re: regular schedule, avoid screen time 2 hours prior to bed time or wear blue light blocking glasses/consider changing screen brightness/settings to dark, dark/quiet/cool bedroom, avoid large meals within 1 hour of sleeping; avoid strenuous exercise within one hour of bedtime; avoid caffeine use after lunch. Try shower/bath; warm non-alcoholic/caffeinated drink prior to bed. If unable to sleep move to another room with lights out until sleepy/write concerns on notepad or read boring material until sleepy then return to bedroom avoid screen time in middle of night. [employer] handout on insomnia and melatonin Discussed 7 hours sleep per night is goal for restorative sleep  Follow up with PCM if no improvement in symptoms with above strategies and consider sleep aid prescription. Patient verbalized understanding of instructions, agreed with plan of care and had no further questions at this time. P2: stress reduction, exercise

## 2024-05-08 NOTE — Telephone Encounter (Signed)
 See results note discussed with patient in person normal levels but to work on sleep hygiene  Patient agreed with plan of care and had no further questions at that time.

## 2024-05-27 ENCOUNTER — Encounter: Payer: Self-pay | Admitting: Registered Nurse

## 2024-05-27 ENCOUNTER — Telehealth: Payer: Self-pay | Admitting: Registered Nurse

## 2024-05-27 DIAGNOSIS — E1169 Type 2 diabetes mellitus with other specified complication: Secondary | ICD-10-CM

## 2024-05-27 DIAGNOSIS — I10 Essential (primary) hypertension: Secondary | ICD-10-CM

## 2024-05-27 MED ORDER — ATORVASTATIN CALCIUM 20 MG PO TABS: 20.0000 mg | ORAL_TABLET | Freq: Every day | ORAL | Status: AC

## 2024-05-27 NOTE — Telephone Encounter (Signed)
 See tcon and my chart messages

## 2024-05-27 NOTE — Telephone Encounter (Signed)
 See tcon dated 05/27/24

## 2024-05-27 NOTE — Telephone Encounter (Signed)
 Patient last filled 90 day supply lisinopril  10mg  and atorvastatin  20mg  po daily 02/21/24  last labs   Latest Reference Range & Units 10/18/23 09:25 04/29/24 00:00  Total CHOL/HDL Ratio 0.0 - 4.4 ratio 2.9   Cholesterol, Total 100 - 199 mg/dL 862   HDL Cholesterol >60 mg/dL 48   Triglycerides 0 - 149 mg/dL 87   VLDL Cholesterol Cal 5 - 40 mg/dL 17   LDL Chol Calc (NIH) 0 - 99 mg/dL 72   Iron 27 - 840 ug/dL  84  UIBC 868 - 574 ug/dL  791  TIBC 749 - 549 ug/dL  707  Iron Saturation 15 - 55 %  29  Vitamin D , 25-Hydroxy 30.0 - 100.0 ng/mL  45.1   Component Ref Range & Units 2 mo ago  Hemoglobin A1c 4.8 - 5.6 % 7.3 Abnormal   Resulting Agency [facility]  Specimen Collected: 03/25/24 14:38   Performed by: [facility] Last Resulted: 03/25/24 14:38  Received From: [facility]  Result Received: 04/01/24 15:12  Last BP Blood Pressure 98/64 03/25/2024 2:27 PM EDT    Pulse 70 03/25/2024 2:27 PM EDT    Temperature 36.5 C (97.7 F) 03/25/2024 2:27 PM EDT    Respiratory Rate 16 03/25/2024 2:27 PM EDT    Oxygen Saturation 98% 03/25/2024 2:27 PM EDT    Inhaled Oxygen Concentration - -    Weight 65.6 kg (144 lb 9.6 oz) 03/25/2024 2:27 PM EDT    Height 160 cm (5' 3) 03/25/2024 2:27 PM EDT    Body Mass Index 25.61 03/25/2024 2:27 PM EDT    Next labs due March 2026  Dispensed 90 day supply each to patient today from [facility]

## 2024-06-09 ENCOUNTER — Encounter: Payer: Self-pay | Admitting: Gastroenterology

## 2024-06-09 ENCOUNTER — Ambulatory Visit: Admitting: Gastroenterology

## 2024-06-09 VITALS — BP 104/58 | HR 76 | Ht 65.0 in | Wt 147.2 lb

## 2024-06-09 DIAGNOSIS — K641 Second degree hemorrhoids: Secondary | ICD-10-CM

## 2024-06-09 NOTE — Progress Notes (Signed)
 PROCEDURE NOTE: The patient presents with symptomatic grade II  hemorrhoids, requesting rubber band ligation of his/her hemorrhoidal disease.  All risks, benefits and alternative forms of therapy were described and informed consent was obtained.  In the Left Lateral Decubitus position anoscopic examination revealed grade II hemorrhoids in the left lateral, right posterior and right anterior position(s).  The anorectum was pre-medicated with [facility] The decision was made to band the left lateral internal hemorrhoid, and the [facility] O'Regan System was used to perform band ligation without complication.  Digital anorectal examination was then performed to assure proper positioning of the band, and to adjust the banded tissue as required.  The patient was discharged home without pain or other issues.  Dietary and behavioral recommendations were given and along with follow-up instructions.     The following adjunctive treatments were recommended:  Benefiber 1 tablespoon twice daily with meals  The patient will return as needed. No complications were encountered and the patient tolerated the procedure well.   LOIS Wilkie Mcgee , MD [phone]

## 2024-06-09 NOTE — Patient Instructions (Signed)

## 2024-06-18 ENCOUNTER — Ambulatory Visit: Payer: Self-pay | Admitting: Gastroenterology

## 2024-08-28 ENCOUNTER — Telehealth: Payer: Self-pay | Admitting: Registered Nurse

## 2024-08-28 ENCOUNTER — Encounter: Payer: Self-pay | Admitting: Registered Nurse

## 2024-08-28 DIAGNOSIS — E1169 Type 2 diabetes mellitus with other specified complication: Secondary | ICD-10-CM

## 2024-08-28 DIAGNOSIS — Z0000 Encounter for general adult medical examination without abnormal findings: Secondary | ICD-10-CM

## 2024-08-28 DIAGNOSIS — I10 Essential (primary) hypertension: Secondary | ICD-10-CM

## 2024-08-28 NOTE — Telephone Encounter (Addendum)
 Patient last filled 90 day supply lisinopril  10mg  po daily and atorvastatin  20mg  po daily 05/27/2024  Dispensed 90 tabs each to patient today from [facility]  last labs 06/24/24 ated to POCT A1C Component 06/24/24 03/25/24 12/11/23 09/04/23 05/14/23 02/12/23  Hemoglobin A1c 6.9 Abnormal  7.3 Abnormal  7.5 Abnormal  7.7 Abnormal  7.1 Abnormal  7.4 Abnormal   Lipids March 2025 but changed medication from simvastatin  to atorvastatin  and has not had lipids since that time.  cardiology appt 07/29/2024 BP 133/71 weight 149lb ht 5'3 BMI 26.55  Executive panel due March 2026 ordered for patient fasting   PCM appt 06/24/2024 BP 104/62  Patient signed HIPAA release, 2027 [employer] ROI and tobacco attestation.    BP less than 130/80 and A1c less than 7 met requirements for 2025 [employer] Well.Be well insurance premium discount evaluation:    Patient is non smoker/vaper completed tobacco attestation for previous 12 months   Epic reviewed by NP and transcribed labs to [employer] forms.  Tobacco attestation signed. [employer] ROI formed signed. Forms placed in paper chart located at [facility].    Patient notified of new clinic light system red clinic closed; yellow assisting another patient weight outside door; green may walk in for assistance.  Discussed my chart use for questions/appt scheduling/results.  Patient stated having difficulty using her my chart and will follow up with RN Karene to trouble shoot.  Discussed employer communicable disease policy with patient e.g. if temperature greater than 100.70F, diarrhea, vomiting or positive covid/flu home testing stay home and contact clinic staff via my chart or clinic email [email or web address] or phone [phone].  Discussed clinic hours M-Th 8a-5p closed Friday/Saturday/Sunday.  NP in clinic Tues 9a-12p and Thur 11a-2p.  Discussed mammogram bus onsite next month and to schedule per email from HR with QR code.  Patient verbalized understanding  information/instructions, agreed with plan of care and had no further questions at this time. Patient requirements for insurance discount FY 2027 starting 07 May 2025

## 2024-09-03 ENCOUNTER — Other Ambulatory Visit: Payer: Self-pay | Admitting: Registered Nurse

## 2024-09-03 ENCOUNTER — Other Ambulatory Visit: Admitting: General Practice

## 2024-09-03 DIAGNOSIS — E1165 Type 2 diabetes mellitus with hyperglycemia: Secondary | ICD-10-CM

## 2024-09-03 DIAGNOSIS — Z0000 Encounter for general adult medical examination without abnormal findings: Secondary | ICD-10-CM

## 2024-09-03 NOTE — Progress Notes (Signed)
 [facility] Labwork drawn, sending to [facility] this afternoon.

## 2024-09-04 ENCOUNTER — Ambulatory Visit: Payer: Self-pay | Admitting: Registered Nurse

## 2024-09-04 DIAGNOSIS — E1165 Type 2 diabetes mellitus with hyperglycemia: Secondary | ICD-10-CM

## 2024-09-04 LAB — CMP12+LP+TP+TSH+6AC+CBC/D/PLT
ALT: 19 [IU]/L (ref 0–32)
AST: 16 [IU]/L (ref 0–40)
Albumin: 4.7 g/dL (ref 3.9–4.9)
Alkaline Phosphatase: 48 [IU]/L — ABNORMAL LOW (ref 49–135)
BUN/Creatinine Ratio: 27 (ref 12–28)
BUN: 18 mg/dL (ref 8–27)
Basophils Absolute: 0 10*3/uL (ref 0.0–0.2)
Basos: 1 %
Bilirubin Total: 0.6 mg/dL (ref 0.0–1.2)
Calcium: 9.6 mg/dL (ref 8.7–10.3)
Chloride: 100 mmol/L (ref 96–106)
Chol/HDL Ratio: 2.7 ratio (ref 0.0–4.4)
Cholesterol, Total: 145 mg/dL (ref 100–199)
Creatinine, Ser: 0.67 mg/dL (ref 0.57–1.00)
EOS (ABSOLUTE): 0.1 10*3/uL (ref 0.0–0.4)
Eos: 2 %
Free Thyroxine Index: 2.1 (ref 1.2–4.9)
GGT: 13 [IU]/L (ref 0–60)
Globulin, Total: 2.1 g/dL (ref 1.5–4.5)
Glucose: 124 mg/dL — ABNORMAL HIGH (ref 70–99)
HDL: 53 mg/dL
Hematocrit: 42.9 % (ref 34.0–46.6)
Hemoglobin: 13.4 g/dL (ref 11.1–15.9)
Immature Grans (Abs): 0 10*3/uL (ref 0.0–0.1)
Immature Granulocytes: 0 %
Iron: 56 ug/dL (ref 27–139)
LDH: 133 [IU]/L (ref 119–226)
LDL Chol Calc (NIH): 72 mg/dL (ref 0–99)
Lymphocytes Absolute: 1.9 10*3/uL (ref 0.7–3.1)
Lymphs: 30 %
MCH: 28.2 pg (ref 26.6–33.0)
MCHC: 31.2 g/dL — ABNORMAL LOW (ref 31.5–35.7)
MCV: 90 fL (ref 79–97)
Monocytes Absolute: 0.5 10*3/uL (ref 0.1–0.9)
Monocytes: 8 %
Neutrophils Absolute: 3.8 10*3/uL (ref 1.4–7.0)
Neutrophils: 59 %
Phosphorus: 4.2 mg/dL (ref 3.0–4.3)
Platelets: 276 10*3/uL (ref 150–450)
Potassium: 4.9 mmol/L (ref 3.5–5.2)
RBC: 4.75 x10E6/uL (ref 3.77–5.28)
RDW: 13 % (ref 11.7–15.4)
Sodium: 141 mmol/L (ref 134–144)
T3 Uptake Ratio: 29 % (ref 24–39)
T4, Total: 7.1 ug/dL (ref 4.5–12.0)
TSH: 1.31 u[IU]/mL (ref 0.450–4.500)
Total Protein: 6.8 g/dL (ref 6.0–8.5)
Triglycerides: 110 mg/dL (ref 0–149)
Uric Acid: 3.7 mg/dL (ref 3.0–7.2)
VLDL Cholesterol Cal: 20 mg/dL (ref 5–40)
WBC: 6.4 10*3/uL (ref 3.4–10.8)
eGFR: 99 mL/min/{1.73_m2}

## 2024-09-04 LAB — HEMOGLOBIN A1C
Est. average glucose Bld gHb Est-mCnc: 148 mg/dL
Hgb A1c MFr Bld: 6.8 % — ABNORMAL HIGH (ref 4.8–5.6)
